# Patient Record
Sex: Female | Born: 1935 | Race: White | Hispanic: No | Smoking: Never smoker
Health system: Southern US, Community
[De-identification: ages and names within clinical notes are randomized; demographics above are authoritative.]

## PROBLEM LIST (undated history)

## (undated) DIAGNOSIS — M1990 Unspecified osteoarthritis, unspecified site: Secondary | ICD-10-CM

## (undated) DIAGNOSIS — I499 Cardiac arrhythmia, unspecified: Secondary | ICD-10-CM

## (undated) DIAGNOSIS — E785 Hyperlipidemia, unspecified: Secondary | ICD-10-CM

## (undated) DIAGNOSIS — I2699 Other pulmonary embolism without acute cor pulmonale: Secondary | ICD-10-CM

## (undated) DIAGNOSIS — H409 Unspecified glaucoma: Secondary | ICD-10-CM

## (undated) DIAGNOSIS — E079 Disorder of thyroid, unspecified: Secondary | ICD-10-CM

## (undated) DIAGNOSIS — I442 Atrioventricular block, complete: Secondary | ICD-10-CM

## (undated) DIAGNOSIS — C4490 Unspecified malignant neoplasm of skin, unspecified: Secondary | ICD-10-CM

## (undated) DIAGNOSIS — J189 Pneumonia, unspecified organism: Secondary | ICD-10-CM

## (undated) DIAGNOSIS — E039 Hypothyroidism, unspecified: Secondary | ICD-10-CM

## (undated) HISTORY — DX: Unspecified glaucoma: H40.9

## (undated) HISTORY — DX: Cardiac arrhythmia, unspecified: I49.9

## (undated) HISTORY — DX: Atrioventricular block, complete: I44.2

## (undated) HISTORY — DX: Hyperlipidemia, unspecified: E78.5

## (undated) HISTORY — DX: Unspecified osteoarthritis, unspecified site: M19.90

## (undated) HISTORY — DX: Disorder of thyroid, unspecified: E07.9

## (undated) HISTORY — PX: CATARACT EXTRACTION W/ INTRAOCULAR LENS  IMPLANT, BILATERAL: SHX1307

---

## 2001-03-30 HISTORY — PX: INSERT / REPLACE / REMOVE PACEMAKER: SUR710

## 2014-04-19 DIAGNOSIS — I7389 Other specified peripheral vascular diseases: Secondary | ICD-10-CM | POA: Diagnosis not present

## 2014-04-19 DIAGNOSIS — L03039 Cellulitis of unspecified toe: Secondary | ICD-10-CM | POA: Diagnosis not present

## 2014-05-24 DIAGNOSIS — I10 Essential (primary) hypertension: Secondary | ICD-10-CM | POA: Diagnosis not present

## 2014-05-24 DIAGNOSIS — I350 Nonrheumatic aortic (valve) stenosis: Secondary | ICD-10-CM | POA: Diagnosis not present

## 2014-05-24 DIAGNOSIS — E039 Hypothyroidism, unspecified: Secondary | ICD-10-CM | POA: Diagnosis not present

## 2014-05-24 DIAGNOSIS — Z950 Presence of cardiac pacemaker: Secondary | ICD-10-CM | POA: Diagnosis not present

## 2014-05-24 DIAGNOSIS — I471 Supraventricular tachycardia: Secondary | ICD-10-CM | POA: Diagnosis not present

## 2014-05-24 DIAGNOSIS — E782 Mixed hyperlipidemia: Secondary | ICD-10-CM | POA: Diagnosis not present

## 2014-05-24 DIAGNOSIS — I495 Sick sinus syndrome: Secondary | ICD-10-CM | POA: Diagnosis not present

## 2014-05-25 DIAGNOSIS — E039 Hypothyroidism, unspecified: Secondary | ICD-10-CM | POA: Diagnosis not present

## 2014-05-25 DIAGNOSIS — Z950 Presence of cardiac pacemaker: Secondary | ICD-10-CM | POA: Diagnosis not present

## 2014-05-25 DIAGNOSIS — M810 Age-related osteoporosis without current pathological fracture: Secondary | ICD-10-CM | POA: Diagnosis not present

## 2014-05-25 DIAGNOSIS — E785 Hyperlipidemia, unspecified: Secondary | ICD-10-CM | POA: Diagnosis not present

## 2014-06-23 DIAGNOSIS — I10 Essential (primary) hypertension: Secondary | ICD-10-CM | POA: Diagnosis not present

## 2014-06-23 DIAGNOSIS — I495 Sick sinus syndrome: Secondary | ICD-10-CM | POA: Diagnosis not present

## 2014-06-23 DIAGNOSIS — E038 Other specified hypothyroidism: Secondary | ICD-10-CM | POA: Diagnosis not present

## 2014-07-19 DIAGNOSIS — I739 Peripheral vascular disease, unspecified: Secondary | ICD-10-CM | POA: Diagnosis not present

## 2014-07-19 DIAGNOSIS — B351 Tinea unguium: Secondary | ICD-10-CM | POA: Diagnosis not present

## 2014-08-01 DIAGNOSIS — R0781 Pleurodynia: Secondary | ICD-10-CM | POA: Diagnosis not present

## 2014-08-01 DIAGNOSIS — S2232XA Fracture of one rib, left side, initial encounter for closed fracture: Secondary | ICD-10-CM | POA: Diagnosis not present

## 2014-08-23 DIAGNOSIS — Z950 Presence of cardiac pacemaker: Secondary | ICD-10-CM | POA: Diagnosis not present

## 2014-08-23 DIAGNOSIS — I495 Sick sinus syndrome: Secondary | ICD-10-CM | POA: Diagnosis not present

## 2014-08-23 DIAGNOSIS — I471 Supraventricular tachycardia: Secondary | ICD-10-CM | POA: Diagnosis not present

## 2014-09-19 DIAGNOSIS — I739 Peripheral vascular disease, unspecified: Secondary | ICD-10-CM | POA: Diagnosis not present

## 2014-09-19 DIAGNOSIS — L03039 Cellulitis of unspecified toe: Secondary | ICD-10-CM | POA: Diagnosis not present

## 2014-10-18 DIAGNOSIS — I495 Sick sinus syndrome: Secondary | ICD-10-CM | POA: Diagnosis not present

## 2014-10-18 DIAGNOSIS — E782 Mixed hyperlipidemia: Secondary | ICD-10-CM | POA: Diagnosis not present

## 2014-10-18 DIAGNOSIS — E039 Hypothyroidism, unspecified: Secondary | ICD-10-CM | POA: Diagnosis not present

## 2014-10-18 DIAGNOSIS — Z950 Presence of cardiac pacemaker: Secondary | ICD-10-CM | POA: Diagnosis not present

## 2014-10-18 DIAGNOSIS — I10 Essential (primary) hypertension: Secondary | ICD-10-CM | POA: Diagnosis not present

## 2014-10-18 DIAGNOSIS — I350 Nonrheumatic aortic (valve) stenosis: Secondary | ICD-10-CM | POA: Diagnosis not present

## 2014-10-18 DIAGNOSIS — I471 Supraventricular tachycardia: Secondary | ICD-10-CM | POA: Diagnosis not present

## 2014-10-19 DIAGNOSIS — M810 Age-related osteoporosis without current pathological fracture: Secondary | ICD-10-CM | POA: Diagnosis not present

## 2014-10-19 DIAGNOSIS — E785 Hyperlipidemia, unspecified: Secondary | ICD-10-CM | POA: Diagnosis not present

## 2014-10-19 DIAGNOSIS — Z950 Presence of cardiac pacemaker: Secondary | ICD-10-CM | POA: Diagnosis not present

## 2014-10-19 DIAGNOSIS — E039 Hypothyroidism, unspecified: Secondary | ICD-10-CM | POA: Diagnosis not present

## 2014-12-21 ENCOUNTER — Ambulatory Visit (INDEPENDENT_AMBULATORY_CARE_PROVIDER_SITE_OTHER): Payer: Medicare Other | Admitting: Internal Medicine

## 2014-12-21 ENCOUNTER — Encounter: Payer: Self-pay | Admitting: Internal Medicine

## 2014-12-21 VITALS — BP 108/60 | HR 85 | Temp 98.7°F | Resp 12 | Ht <= 58 in | Wt 110.0 lb

## 2014-12-21 DIAGNOSIS — H409 Unspecified glaucoma: Secondary | ICD-10-CM

## 2014-12-21 DIAGNOSIS — M810 Age-related osteoporosis without current pathological fracture: Secondary | ICD-10-CM

## 2014-12-21 DIAGNOSIS — E039 Hypothyroidism, unspecified: Secondary | ICD-10-CM

## 2014-12-21 DIAGNOSIS — B351 Tinea unguium: Secondary | ICD-10-CM | POA: Diagnosis not present

## 2014-12-21 DIAGNOSIS — Z23 Encounter for immunization: Secondary | ICD-10-CM

## 2014-12-21 DIAGNOSIS — Z950 Presence of cardiac pacemaker: Secondary | ICD-10-CM

## 2014-12-21 NOTE — Progress Notes (Signed)
Pre visit review using our clinic review tool, if applicable. No additional management support is needed unless otherwise documented below in the visit note. 

## 2014-12-21 NOTE — Patient Instructions (Signed)
We will not check labs today and have sent in the referral for the eye doctor, the foot doctor, and the pacemaker doctor.   We have given you the flu shot today.  Come back in about 6 months for a check on your thyroid.   Health Maintenance Adopting a healthy lifestyle and getting preventive care can go a long way to promote health and wellness. Talk with your health care provider about what schedule of regular examinations is right for you. This is a good chance for you to check in with your provider about disease prevention and staying healthy. In between checkups, there are plenty of things you can do on your own. Experts have done a lot of research about which lifestyle changes and preventive measures are most likely to keep you healthy. Ask your health care provider for more information. WEIGHT AND DIET  Eat a healthy diet  Be sure to include plenty of vegetables, fruits, low-fat dairy products, and lean protein.  Do not eat a lot of foods high in solid fats, added sugars, or salt.  Get regular exercise. This is one of the most important things you can do for your health.  Most adults should exercise for at least 150 minutes each week. The exercise should increase your heart rate and make you sweat (moderate-intensity exercise).  Most adults should also do strengthening exercises at least twice a week. This is in addition to the moderate-intensity exercise.  Maintain a healthy weight  Body mass index (BMI) is a measurement that can be used to identify possible weight problems. It estimates body fat based on height and weight. Your health care provider can help determine your BMI and help you achieve or maintain a healthy weight.  For females 17 years of age and older:   A BMI below 18.5 is considered underweight.  A BMI of 18.5 to 24.9 is normal.  A BMI of 25 to 29.9 is considered overweight.  A BMI of 30 and above is considered obese.  Watch levels of cholesterol and blood  lipids  You should start having your blood tested for lipids and cholesterol at 79 years of age, then have this test every 5 years.  You may need to have your cholesterol levels checked more often if:  Your lipid or cholesterol levels are high.  You are older than 79 years of age.  You are at high risk for heart disease.  CANCER SCREENING   Lung Cancer  Lung cancer screening is recommended for adults 32-53 years old who are at high risk for lung cancer because of a history of smoking.  A yearly low-dose CT scan of the lungs is recommended for people who:  Currently smoke.  Have quit within the past 15 years.  Have at least a 30-pack-year history of smoking. A pack year is smoking an average of one pack of cigarettes a day for 1 year.  Yearly screening should continue until it has been 15 years since you quit.  Yearly screening should stop if you develop a health problem that would prevent you from having lung cancer treatment.  Breast Cancer  Practice breast self-awareness. This means understanding how your breasts normally appear and feel.  It also means doing regular breast self-exams. Let your health care provider know about any changes, no matter how small.  If you are in your 20s or 30s, you should have a clinical breast exam (CBE) by a health care provider every 1-3 years as part of  a regular health exam.  If you are 66 or older, have a CBE every year. Also consider having a breast X-ray (mammogram) every year.  If you have a family history of breast cancer, talk to your health care provider about genetic screening.  If you are at high risk for breast cancer, talk to your health care provider about having an MRI and a mammogram every year.  Breast cancer gene (BRCA) assessment is recommended for women who have family members with BRCA-related cancers. BRCA-related cancers include:  Breast.  Ovarian.  Tubal.  Peritoneal cancers.  Results of the assessment  will determine the need for genetic counseling and BRCA1 and BRCA2 testing. Cervical Cancer Routine pelvic examinations to screen for cervical cancer are no longer recommended for nonpregnant women who are considered low risk for cancer of the pelvic organs (ovaries, uterus, and vagina) and who do not have symptoms. A pelvic examination may be necessary if you have symptoms including those associated with pelvic infections. Ask your health care provider if a screening pelvic exam is right for you.   The Pap test is the screening test for cervical cancer for women who are considered at risk.  If you had a hysterectomy for a problem that was not cancer or a condition that could lead to cancer, then you no longer need Pap tests.  If you are older than 65 years, and you have had normal Pap tests for the past 10 years, you no longer need to have Pap tests.  If you have had past treatment for cervical cancer or a condition that could lead to cancer, you need Pap tests and screening for cancer for at least 20 years after your treatment.  If you no longer get a Pap test, assess your risk factors if they change (such as having a new sexual partner). This can affect whether you should start being screened again.  Some women have medical problems that increase their chance of getting cervical cancer. If this is the case for you, your health care provider may recommend more frequent screening and Pap tests.  The human papillomavirus (HPV) test is another test that may be used for cervical cancer screening. The HPV test looks for the virus that can cause cell changes in the cervix. The cells collected during the Pap test can be tested for HPV.  The HPV test can be used to screen women 66 years of age and older. Getting tested for HPV can extend the interval between normal Pap tests from three to five years.  An HPV test also should be used to screen women of any age who have unclear Pap test results.  After  79 years of age, women should have HPV testing as often as Pap tests.  Colorectal Cancer  This type of cancer can be detected and often prevented.  Routine colorectal cancer screening usually begins at 79 years of age and continues through 79 years of age.  Your health care provider may recommend screening at an earlier age if you have risk factors for colon cancer.  Your health care provider may also recommend using home test kits to check for hidden blood in the stool.  A small camera at the end of a tube can be used to examine your colon directly (sigmoidoscopy or colonoscopy). This is done to check for the earliest forms of colorectal cancer.  Routine screening usually begins at age 22.  Direct examination of the colon should be repeated every 5-10 years through  79 years of age. However, you may need to be screened more often if early forms of precancerous polyps or small growths are found. Skin Cancer  Check your skin from head to toe regularly.  Tell your health care provider about any new moles or changes in moles, especially if there is a change in a mole's shape or color.  Also tell your health care provider if you have a mole that is larger than the size of a pencil eraser.  Always use sunscreen. Apply sunscreen liberally and repeatedly throughout the day.  Protect yourself by wearing long sleeves, pants, a wide-brimmed hat, and sunglasses whenever you are outside. HEART DISEASE, DIABETES, AND HIGH BLOOD PRESSURE   Have your blood pressure checked at least every 1-2 years. High blood pressure causes heart disease and increases the risk of stroke.  If you are between 57 years and 80 years old, ask your health care provider if you should take aspirin to prevent strokes.  Have regular diabetes screenings. This involves taking a blood sample to check your fasting blood sugar level.  If you are at a normal weight and have a low risk for diabetes, have this test once every  three years after 79 years of age.  If you are overweight and have a high risk for diabetes, consider being tested at a younger age or more often. PREVENTING INFECTION  Hepatitis B  If you have a higher risk for hepatitis B, you should be screened for this virus. You are considered at high risk for hepatitis B if:  You were born in a country where hepatitis B is common. Ask your health care provider which countries are considered high risk.  Your parents were born in a high-risk country, and you have not been immunized against hepatitis B (hepatitis B vaccine).  You have HIV or AIDS.  You use needles to inject street drugs.  You live with someone who has hepatitis B.  You have had sex with someone who has hepatitis B.  You get hemodialysis treatment.  You take certain medicines for conditions, including cancer, organ transplantation, and autoimmune conditions. Hepatitis C  Blood testing is recommended for:  Everyone born from [birth year] through 1963.  Anyone with known risk factors for hepatitis C. Sexually transmitted infections (STIs)  You should be screened for sexually transmitted infections (STIs) including gonorrhea and chlamydia if:  You are sexually active and are younger than 79 years of age.  You are older than 79 years of age and your health care provider tells you that you are at risk for this type of infection.  Your sexual activity has changed since you were last screened and you are at an increased risk for chlamydia or gonorrhea. Ask your health care provider if you are at risk.  If you do not have HIV, but are at risk, it may be recommended that you take a prescription medicine daily to prevent HIV infection. This is called pre-exposure prophylaxis (PrEP). You are considered at risk if:  You are sexually active and do not regularly use condoms or know the HIV status of your partner(s).  You take drugs by injection.  You are sexually active with a partner who  has HIV. Talk with your health care provider about whether you are at high risk of being infected with HIV. If you choose to begin PrEP, you should first be tested for HIV. You should then be tested every 3 months for as long as you are taking PrEP.  PREGNANCY   If you are premenopausal and you may become pregnant, ask your health care provider about preconception counseling.  If you may become pregnant, take 400 to 800 micrograms (mcg) of folic acid every day.  If you want to prevent pregnancy, talk to your health care provider about birth control (contraception). OSTEOPOROSIS AND MENOPAUSE   Osteoporosis is a disease in which the bones lose minerals and strength with aging. This can result in serious bone fractures. Your risk for osteoporosis can be identified using a bone density scan.  If you are 4 years of age or older, or if you are at risk for osteoporosis and fractures, ask your health care provider if you should be screened.  Ask your health care provider whether you should take a calcium or vitamin D supplement to lower your risk for osteoporosis.  Menopause may have certain physical symptoms and risks.  Hormone replacement therapy may reduce some of these symptoms and risks. Talk to your health care provider about whether hormone replacement therapy is right for you.  HOME CARE INSTRUCTIONS   Schedule regular health, dental, and eye exams.  Stay current with your immunizations.   Do not use any tobacco products including cigarettes, chewing tobacco, or electronic cigarettes.  If you are pregnant, do not drink alcohol.  If you are breastfeeding, limit how much and how often you drink alcohol.  Limit alcohol intake to no more than 1 drink per day for nonpregnant women. One drink equals 12 ounces of beer, 5 ounces of wine, or 1 ounces of hard liquor.  Do not use street drugs.  Do not share needles.  Ask your health care provider for help if you need support or  information about quitting drugs.  Tell your health care provider if you often feel depressed.  Tell your health care provider if you have ever been abused or do not feel safe at home. Document Released: 09/29/2010 Document Revised: 07/31/2013 Document Reviewed: 02/15/2013 [facility] Patient Information 2015 [place], [place]. This information is not intended to replace advice given to you by your health care provider. Make sure you discuss any questions you have with your health care provider.

## 2014-12-22 ENCOUNTER — Encounter: Payer: Self-pay | Admitting: Internal Medicine

## 2014-12-22 DIAGNOSIS — H409 Unspecified glaucoma: Secondary | ICD-10-CM | POA: Insufficient documentation

## 2014-12-22 DIAGNOSIS — E039 Hypothyroidism, unspecified: Secondary | ICD-10-CM | POA: Insufficient documentation

## 2014-12-22 DIAGNOSIS — B351 Tinea unguium: Secondary | ICD-10-CM | POA: Insufficient documentation

## 2014-12-22 DIAGNOSIS — M810 Age-related osteoporosis without current pathological fracture: Secondary | ICD-10-CM | POA: Insufficient documentation

## 2014-12-22 DIAGNOSIS — Z950 Presence of cardiac pacemaker: Secondary | ICD-10-CM | POA: Insufficient documentation

## 2014-12-22 NOTE — Progress Notes (Signed)
   Subjective:    Patient ID: Gloria Gonzalez, female    DOB: [birth year], 81 y.o.   MRN: [id number]  HPI The patient is a 81 YO female coming in for several medical conditions that need continuity. She has recently moved from [place] and needs a new doctor for her pacemaker (placed in 2003 and 100% paced, she is not clear of the underlying condition). She needs a glaucoma doctor (she is being monitored and treated for that). No vision loss. Next concern is her feet (generally goes to podiatry for toenails trimmed and also had some toenail fungus they were treating). No new complaints.   PMH, [facility], social history reviewed and updated.   Review of Systems  Constitutional: Negative for fever, activity change, appetite change, fatigue and unexpected weight change.  HENT: Negative.   Eyes: Negative.   Respiratory: Negative for cough, chest tightness, shortness of breath and wheezing.   Cardiovascular: Negative for chest pain, palpitations and leg swelling.  Gastrointestinal: Negative for abdominal pain, diarrhea, constipation and abdominal distention.  Musculoskeletal: Negative.   Skin: Negative.   Neurological: Negative.   Psychiatric/Behavioral: Negative.       Objective:   Physical Exam  Constitutional: She is oriented to person, place, and time. She appears well-developed and well-nourished.  HENT:  Head: Normocephalic and atraumatic.  Eyes: EOM are normal.  Neck: Normal range of motion.  Cardiovascular: Normal rate.   Pulmonary/Chest: Effort normal and breath sounds normal. No respiratory distress. She has no wheezes. She has no rales.  Abdominal: Soft. Bowel sounds are normal. She exhibits no distension. There is no tenderness. There is no rebound.  Musculoskeletal: She exhibits no edema.  Neurological: She is alert and oriented to person, place, and time. Coordination normal.  Skin: Skin is warm and dry.  Psychiatric: She has a normal mood and affect.   Filed Vitals:   12/21/14 1510   BP: 108/60  Pulse: 85  Temp: 98.7 F (37.1 C)  TempSrc: Oral  Resp: 12  Height: 4\' 9"  (1.448 m)  Weight: 110 lb (49.896 kg)  SpO2: 94%      Assessment & Plan:  Flu shot given at visit.

## 2014-12-22 NOTE — Assessment & Plan Note (Signed)
Taking synthroid 50 mcg daily and levels recently checked before she moves. Will not recheck today but will obtain records. Check levels next visit.

## 2014-12-22 NOTE — Assessment & Plan Note (Signed)
Referral to podiatry for management and toenail maintenance.

## 2014-12-22 NOTE — Assessment & Plan Note (Signed)
Referral to EP for battery life monitoring. She will likely be getting near the end of life since it is in place 13 year. Getting records for details about the device and why it was needed. She cannot recall.

## 2014-12-22 NOTE — Assessment & Plan Note (Signed)
This is why she is taking the evista however unclear why she is on this agent. She cannot recall how long she has been on so getting records to verify and see if it is time to stop. She has had unprovoked rib fractures in the past.

## 2014-12-22 NOTE — Assessment & Plan Note (Signed)
Referral to opththomology for ongoing management.

## 2015-01-02 ENCOUNTER — Telehealth: Payer: Self-pay | Admitting: *Deleted

## 2015-01-02 ENCOUNTER — Telehealth: Payer: Self-pay | Admitting: Internal Medicine

## 2015-01-02 NOTE — Telephone Encounter (Signed)
Amy from [facility] called, they never got the pt's cardiac records, we will have pt sign a med release when she comes to the appt and try to get the records while she is here

## 2015-01-02 NOTE — Telephone Encounter (Signed)
Brandy at [facility] called and she was looking at your last notes about requesting pt's cardiac records for her pace maker.  She is wondering if you received them yet and if you have can you fax them to [phone].  Pt's appointment is on 10/7 with them. You can reach [place] at [phone].

## 2015-01-02 NOTE — Telephone Encounter (Signed)
called [facility] to see if Dr. Valene Bors got cardiac med records as she stated she would do in her OV, spoke with lori, she left a msg with the docs asst to call me back or fax me the records, RECORDS REQUESTED.Marland Kitchen

## 2015-01-02 NOTE — Telephone Encounter (Signed)
Spoke with [place]. She will have patient fill out a records release form at the visit. I do not have the cardiology notes.

## 2015-01-03 ENCOUNTER — Telehealth: Payer: Self-pay | Admitting: Internal Medicine

## 2015-01-03 NOTE — Telephone Encounter (Signed)
Rec'd from [facility] forward 8 pages to Dr. Doug Sou

## 2015-01-04 ENCOUNTER — Encounter: Payer: Self-pay | Admitting: Cardiology

## 2015-01-04 ENCOUNTER — Ambulatory Visit (INDEPENDENT_AMBULATORY_CARE_PROVIDER_SITE_OTHER): Payer: Medicare Other | Admitting: Cardiology

## 2015-01-04 VITALS — BP 118/70 | HR 77 | Ht <= 58 in | Wt 112.0 lb

## 2015-01-04 DIAGNOSIS — Z950 Presence of cardiac pacemaker: Secondary | ICD-10-CM

## 2015-01-04 DIAGNOSIS — I442 Atrioventricular block, complete: Secondary | ICD-10-CM

## 2015-01-04 LAB — CUP PACEART INCLINIC DEVICE CHECK
Battery Voltage: 2.73 V
Brady Statistic AP VP Percent: 0.6 %
Lead Channel Pacing Threshold Pulse Width: 0.2 ms
Lead Channel Pacing Threshold Pulse Width: 0.25 ms
Lead Channel Setting Pacing Amplitude: 1.5 V
Lead Channel Setting Pacing Pulse Width: 0.2 ms
MDC IDC MSMT LEADCHNL RA IMPEDANCE VALUE: 730 Ohm
MDC IDC MSMT LEADCHNL RA PACING THRESHOLD AMPLITUDE: 1 V
MDC IDC MSMT LEADCHNL RA SENSING INTR AMPL: 2 mV
MDC IDC MSMT LEADCHNL RV PACING THRESHOLD AMPLITUDE: 0.5 V
MDC IDC SESS DTM: 20161007171933
MDC IDC SET LEADCHNL RA PACING AMPLITUDE: 2 V
MDC IDC SET LEADCHNL RV SENSING SENSITIVITY: 2.8 mV
MDC IDC STAT BRADY AP VS PERCENT: 0.1 % — AB
MDC IDC STAT BRADY AS VP PERCENT: 99.3 %
MDC IDC STAT BRADY AS VS PERCENT: 0.1 % — AB

## 2015-01-04 NOTE — Progress Notes (Signed)
Electrophysiology Office Note   Date:  01/04/2015   ID:  Gloria Gonzalez, DOB 09-12-1921, MRN [id number]  PCP:  Hoyt Koch, MD   Primary Electrophysiologist:  Khai Arrona Meredith Leeds, MD    Chief Complaint  Patient presents with  . Pacemaker Problem     History of Present Illness: Ronisha Herringshaw is a 81 y.o. female who presents today for electrophysiology evaluation.   She has a pacemaker which was placed in 2003.  The device was implanted for an irregular rhythm, likely some sort of heart block.     Today, she denies symptoms of palpitations, chest pain, shortness of breath, orthopnea, PND, lower extremity edema, claudication, dizziness, presyncope, syncope, bleeding, or neurologic sequela. The patient is tolerating medications without difficulties and is otherwise without complaint today.    Past Medical History  Diagnosis Date  . Arthritis   . Glaucoma   . Hyperlipidemia   . Thyroid disease   . Arrhythmia    Past Surgical History  Procedure Laterality Date  . Eye surgery    . Pacemaker insertion  2003     Current Outpatient Prescriptions  Medication Sig Dispense Refill  . atorvastatin (LIPITOR) 20 MG tablet Take 20 mg by mouth daily at 6 PM.   2  . Calcium Citrate-Vitamin D (CALCIUM + D PO) Take 1 tablet by mouth daily.    Marland Kitchen omega-3 acid ethyl esters (LOVAZA) 1 G capsule Take 1 g by mouth 2 (two) times daily.   0  . raloxifene (EVISTA) 60 MG tablet Take 60 mg by mouth daily.   2  . SYNTHROID 50 MCG tablet Take 50 mcg by mouth daily before breakfast.   2   No current facility-administered medications for this visit.    Allergies:   Review of patient's allergies indicates no known allergies.   Social History:  The patient  reports that she has never smoked. She does not have any smokeless tobacco history on file. She reports that she does not drink alcohol or use illicit drugs.   Family History:  [name] family history includes Arthritis in her  mother; Heart disease in her brother and sister.    ROS:  Please see the history of present illness.   All other systems are reviewed and negative.    PHYSICAL EXAM: VS:  BP 118/70 mmHg  Pulse 77  Ht 4\' 9"  (1.448 m)  Wt 112 lb (50.803 kg)  BMI 24.23 kg/m2 , BMI Body mass index is 24.23 kg/(m^2). GEN: Well nourished, well developed, in no acute distress HEENT: normal Neck: no JVD, carotid bruits, or masses Cardiac: RRR; no murmurs, rubs, or gallops,no edema  Respiratory:  clear to auscultation bilaterally, normal work of breathing GI: soft, nontender, nondistended, + BS MS: no deformity or atrophy Skin: warm and dry, device pocket is well healed Neuro:  Strength and sensation are intact Psych: euthymic mood, full affect  EKG:  EKG is ordered today. The ekg ordered today shows sinus rhythm, V paced  Device interrogation is reviewed today in detail.  See PaceArt for details.  Functioning well, 17 months left on battery.   Recent Labs: No results found for requested labs within last 365 days.    Lipid Panel  No results found for: CHOL, TRIG, HDL, CHOLHDL, VLDL, LDLCALC, LDLDIRECT   Wt Readings from Last 3 Encounters:  01/04/15 112 lb (50.803 kg)  12/21/14 110 lb (49.896 kg)   ASSESSMENT AND PLAN:  1.  Pacemaker: The patient does not know  the reason for the implant currently.  100% V paced currently.  She still has 17 months until she requires generator change.  We Longino Trefz plan for her to come back to clinic every 3 months for device check and I Lorrie Gargan see her yearly.  Constance Hackenberg not change any medications today as she has no acute complaints.      Current medicines are reviewed at length with the patient today.   The patient does not have concerns regarding her medicines.  The following changes were made today:  none  Labs/ tests ordered today include: none  No orders of the defined types were placed in this encounter.     Disposition:   FU with Velia Pamer 1  year  Signed, Janette Harvie Meredith Leeds, MD  01/04/2015 4:26 PM     [address] [place] [address] [phone] (office) [phone] (fax)

## 2015-01-04 NOTE — Patient Instructions (Signed)
Medication Instructions:  Your physician recommends that you continue on your current medications as directed. Please refer to the Current Medication list given to you today.  Labwork: None ordered  Testing/Procedures: None ordered  Follow-Up: Your physician recommends that you schedule a follow-up appointment in: 3 months with device clinic.  Your physician wants you to follow-up in: 1 year with Dr. Curt Bears.  You will receive a reminder letter in the mail two months in advance. If you don't receive a letter, please call our office to schedule the follow-up appointment.  Any Other Special Instructions Will Be Listed Below (If Applicable). Thank you for choosing [facility]!!   Trinidad Curet, RN [phone]

## 2015-01-11 ENCOUNTER — Encounter: Payer: Self-pay | Admitting: Podiatry

## 2015-01-11 ENCOUNTER — Ambulatory Visit (INDEPENDENT_AMBULATORY_CARE_PROVIDER_SITE_OTHER): Payer: Medicare Other | Admitting: Podiatry

## 2015-01-11 VITALS — BP 101/58 | HR 89 | Resp 12

## 2015-01-11 DIAGNOSIS — M79673 Pain in unspecified foot: Secondary | ICD-10-CM

## 2015-01-11 DIAGNOSIS — B351 Tinea unguium: Secondary | ICD-10-CM | POA: Diagnosis not present

## 2015-01-11 DIAGNOSIS — M79609 Pain in unspecified limb: Principal | ICD-10-CM

## 2015-01-11 NOTE — Progress Notes (Signed)
   Subjective:    Patient ID: Gloria Gonzalez, female    DOB: Aug 09, 1921, 81 y.o.   MRN: [id number]  HPI  Patient presents here today for a B/L debridement. She says she has moved here from [place] area for nail care.  She says she has been seeing a podiatrist who regularly treats her fungus nails. She says her nails have grown long and are painful walking and wearing her shoes.  Review of Systems  Cardiovascular: Positive for leg swelling.  Musculoskeletal:       Joint pain  All other systems reviewed and are negative.      Objective:   Physical Exam GENERAL APPEARANCE: Alert, conversant. Appropriately groomed. No acute distress.  VASCULAR: Pedal pulses are not  palpable at  [facility] and PT bilateral.  Capillary refill time is immediate to all digits,  Cold feet noted.   NEUROLOGIC: sensation is normal to 5.07 monofilament at 5/5 sites bilateral.  Light touch is intact bilateral, Muscle strength normal.  MUSCULOSKELETAL: acceptable muscle strength, tone and stability bilateral.  Intrinsic muscluature intact bilateral.  Rectus appearance of foot and digits noted bilateral.   DERMATOLOGIC: skin color, texture, and turgor are within normal limits.  No preulcerative lesions or ulcers  are seen, no interdigital maceration noted.  No open lesions present. . No drainage noted.  NAILS  Thick disfigured discolored nails  Hallux B/l         Assessment & Plan:  Onychomycosis  B/L  IE  Debridement of long thick disfigured discolored nails both feet.  RTC 3 months.

## 2015-01-15 ENCOUNTER — Encounter: Payer: Self-pay | Admitting: Cardiology

## 2015-02-11 ENCOUNTER — Telehealth: Payer: Self-pay | Admitting: Internal Medicine

## 2015-02-11 ENCOUNTER — Other Ambulatory Visit: Payer: Self-pay | Admitting: Geriatric Medicine

## 2015-02-11 DIAGNOSIS — Z950 Presence of cardiac pacemaker: Secondary | ICD-10-CM

## 2015-02-11 MED ORDER — OMEGA-3-ACID ETHYL ESTERS 1 G PO CAPS: 1.0000 g | ORAL_CAPSULE | Freq: Two times a day (BID) | ORAL | Status: DC

## 2015-02-11 NOTE — Telephone Encounter (Signed)
Pt requesting refill for omega-3 acid ethyl esters (LOVAZA) 1 G capsule [phone]  Pharmacy is [facility]

## 2015-03-22 DIAGNOSIS — H01002 Unspecified blepharitis right lower eyelid: Secondary | ICD-10-CM | POA: Diagnosis not present

## 2015-03-22 DIAGNOSIS — H01004 Unspecified blepharitis left upper eyelid: Secondary | ICD-10-CM | POA: Diagnosis not present

## 2015-03-22 DIAGNOSIS — H353131 Nonexudative age-related macular degeneration, bilateral, early dry stage: Secondary | ICD-10-CM | POA: Diagnosis not present

## 2015-03-22 DIAGNOSIS — H01005 Unspecified blepharitis left lower eyelid: Secondary | ICD-10-CM | POA: Diagnosis not present

## 2015-03-22 DIAGNOSIS — H01001 Unspecified blepharitis right upper eyelid: Secondary | ICD-10-CM | POA: Diagnosis not present

## 2015-03-22 DIAGNOSIS — H401133 Primary open-angle glaucoma, bilateral, severe stage: Secondary | ICD-10-CM | POA: Diagnosis not present

## 2015-03-22 DIAGNOSIS — H524 Presbyopia: Secondary | ICD-10-CM | POA: Diagnosis not present

## 2015-04-10 ENCOUNTER — Encounter: Payer: Self-pay | Admitting: Cardiology

## 2015-04-10 ENCOUNTER — Ambulatory Visit (INDEPENDENT_AMBULATORY_CARE_PROVIDER_SITE_OTHER): Payer: Medicare Other | Admitting: *Deleted

## 2015-04-10 DIAGNOSIS — Z950 Presence of cardiac pacemaker: Secondary | ICD-10-CM

## 2015-04-10 DIAGNOSIS — I442 Atrioventricular block, complete: Secondary | ICD-10-CM | POA: Diagnosis not present

## 2015-04-10 LAB — CUP PACEART INCLINIC DEVICE CHECK
Brady Statistic AP VS Percent: 0.1 % — CL
Date Time Interrogation Session: 20170111170426
Implantable Lead Implant Date: 20030601
Implantable Lead Implant Date: 20030601
Implantable Lead Location: 753860
Implantable Lead Model: 4092
Lead Channel Pacing Threshold Amplitude: 1 V
Lead Channel Pacing Threshold Amplitude: 5 V
Lead Channel Pacing Threshold Pulse Width: 0.2 ms
Lead Channel Pacing Threshold Pulse Width: 0.25 ms
Lead Channel Sensing Intrinsic Amplitude: 2 mV
Lead Channel Setting Pacing Amplitude: 1.5 V
Lead Channel Setting Pacing Amplitude: 2 V
Lead Channel Setting Pacing Pulse Width: 0.2 ms
MDC IDC LEAD LOCATION: 753859
MDC IDC LEAD MODEL: 4592
MDC IDC MSMT LEADCHNL RA IMPEDANCE VALUE: 760 Ohm
MDC IDC MSMT LEADCHNL RV IMPEDANCE VALUE: 1020 Ohm
MDC IDC SET LEADCHNL RV SENSING SENSITIVITY: 2.8 mV
MDC IDC STAT BRADY AP VP PERCENT: 0.2 %
MDC IDC STAT BRADY AS VP PERCENT: 99.8 %
MDC IDC STAT BRADY AS VS PERCENT: 0.1 % — AB

## 2015-04-10 NOTE — Progress Notes (Signed)
Pacemaker check in clinic. Normal device function. Thresholds, sensing, impedances consistent with previous measurements. Device programmed to maximize longevity. 25 mode switches- longest 1 min. 1 high ventricular rate noted- 2 seconds @ 197bpm, no EGM. Device programmed at appropriate safety margins. Histogram distribution appropriate for patient activity level. Device programmed to optimize intrinsic conduction. Estimated longevity <1-31 months with an average of 16 months. ROV with device clinic in 2 months for battery check. ROV with WC in October.

## 2015-04-12 ENCOUNTER — Ambulatory Visit: Payer: Medicare Other | Admitting: Podiatry

## 2015-04-17 ENCOUNTER — Ambulatory Visit (INDEPENDENT_AMBULATORY_CARE_PROVIDER_SITE_OTHER): Payer: Medicare Other | Admitting: Podiatry

## 2015-04-17 ENCOUNTER — Encounter: Payer: Self-pay | Admitting: Podiatry

## 2015-04-17 DIAGNOSIS — B351 Tinea unguium: Secondary | ICD-10-CM

## 2015-04-17 DIAGNOSIS — M79609 Pain in unspecified limb: Principal | ICD-10-CM

## 2015-04-17 DIAGNOSIS — M79673 Pain in unspecified foot: Secondary | ICD-10-CM | POA: Diagnosis not present

## 2015-04-17 NOTE — Progress Notes (Signed)
   Subjective:    Patient ID: Gloria Gonzalez, female    DOB: 01-Aug-1921, 82 y.o.   MRN: [id number]  HPI  Patient presents here today for a B/L debridement. She says she has moved here from [place] area for nail care.  She says she has been seeing a podiatrist who regularly treats her fungus nails. She says her nails have grown long and are painful walking and wearing her shoes. She presents for preventive foot care services.  Review of Systems  Cardiovascular: Positive for leg swelling.  Musculoskeletal:       Joint pain  All other systems reviewed and are negative.      Objective:   Physical Exam GENERAL APPEARANCE: Alert, conversant. Appropriately groomed. No acute distress.  VASCULAR: Pedal pulses are not  palpable at  [facility] and PT bilateral.  Capillary refill time is immediate to all digits,  Cold feet noted.   NEUROLOGIC: sensation is normal to 5.07 monofilament at 5/5 sites bilateral.  Light touch is intact bilateral, Muscle strength normal.  MUSCULOSKELETAL: acceptable muscle strength, tone and stability bilateral.  Intrinsic muscluature intact bilateral.  Rectus appearance of foot and digits noted bilateral.   DERMATOLOGIC: skin color, texture, and turgor are within normal limits.  No preulcerative lesions or ulcers  are seen, no interdigital maceration noted.  No open lesions present. . No drainage noted.  NAILS  Thick disfigured discolored nails  Hallux B/l         Assessment & Plan:  Onychomycosis  B/L  IE  Debridement of long thick disfigured discolored nails both feet.  RTC 3 months.

## 2015-04-23 DIAGNOSIS — H401133 Primary open-angle glaucoma, bilateral, severe stage: Secondary | ICD-10-CM | POA: Diagnosis not present

## 2015-04-23 DIAGNOSIS — H04123 Dry eye syndrome of bilateral lacrimal glands: Secondary | ICD-10-CM | POA: Diagnosis not present

## 2015-04-25 ENCOUNTER — Telehealth: Payer: Self-pay | Admitting: Internal Medicine

## 2015-04-25 ENCOUNTER — Other Ambulatory Visit: Payer: Self-pay | Admitting: Geriatric Medicine

## 2015-04-25 DIAGNOSIS — Z950 Presence of cardiac pacemaker: Secondary | ICD-10-CM

## 2015-04-25 DIAGNOSIS — E039 Hypothyroidism, unspecified: Secondary | ICD-10-CM

## 2015-04-25 MED ORDER — ATORVASTATIN CALCIUM 20 MG PO TABS: 20.0000 mg | ORAL_TABLET | Freq: Every day | ORAL | Status: DC

## 2015-04-25 NOTE — Telephone Encounter (Signed)
Pt requesting refills for atorvastatin (LIPITOR) 20 MG tablet [id number], raloxifene (EVISTA) 60 MG tablet [id number], and SYNTHROID 50 MCG tablet [id number]  Pharmacy is [facility]

## 2015-04-25 NOTE — Telephone Encounter (Signed)
Sent to pharmacy 

## 2015-04-29 ENCOUNTER — Other Ambulatory Visit: Payer: Self-pay | Admitting: Internal Medicine

## 2015-04-29 DIAGNOSIS — Z950 Presence of cardiac pacemaker: Secondary | ICD-10-CM

## 2015-04-29 DIAGNOSIS — E039 Hypothyroidism, unspecified: Secondary | ICD-10-CM

## 2015-04-29 MED ORDER — ATORVASTATIN CALCIUM 20 MG PO TABS: 20.0000 mg | ORAL_TABLET | Freq: Every day | ORAL | Status: DC

## 2015-04-29 MED ORDER — SYNTHROID 50 MCG PO TABS
50.0000 ug | ORAL_TABLET | Freq: Every day | ORAL | Status: DC
Start: 2015-04-29 — End: 2016-06-23

## 2015-04-29 MED ORDER — SYNTHROID 50 MCG PO TABS: 50.0000 ug | ORAL_TABLET | Freq: Every day | ORAL | Status: DC

## 2015-04-29 NOTE — Telephone Encounter (Signed)
Patient is requesting synthroid and atorvastatin to be sent to [facility] .

## 2015-04-29 NOTE — Telephone Encounter (Signed)
rx faxed to mail order 

## 2015-04-29 NOTE — Telephone Encounter (Signed)
Sent to pharmacy 

## 2015-06-10 ENCOUNTER — Ambulatory Visit (INDEPENDENT_AMBULATORY_CARE_PROVIDER_SITE_OTHER): Payer: Medicare Other | Admitting: *Deleted

## 2015-06-10 ENCOUNTER — Encounter: Payer: Self-pay | Admitting: Cardiology

## 2015-06-10 DIAGNOSIS — Z950 Presence of cardiac pacemaker: Secondary | ICD-10-CM

## 2015-06-10 LAB — CUP PACEART INCLINIC DEVICE CHECK
Battery Voltage: 2.72 V
Date Time Interrogation Session: 20170313130538
Implantable Lead Implant Date: 20031112
Implantable Lead Location: 753860
Implantable Lead Model: 4092
Lead Channel Impedance Value: 1065 Ohm
Lead Channel Impedance Value: 746 Ohm
Lead Channel Setting Pacing Amplitude: 2 V
Lead Channel Setting Pacing Pulse Width: 0.2 ms
MDC IDC LEAD IMPLANT DT: 20031112
MDC IDC LEAD LOCATION: 753859
MDC IDC LEAD MODEL: 4592
MDC IDC MSMT BATTERY IMPEDANCE: 4519 Ohm
MDC IDC MSMT BATTERY REMAINING LONGEVITY: 14 mo
MDC IDC SET LEADCHNL RV PACING AMPLITUDE: 1.5 V
MDC IDC SET LEADCHNL RV SENSING SENSITIVITY: 2.8 mV

## 2015-06-10 NOTE — Progress Notes (Signed)
Battery check in clinic. Estimated longevity 14 months (<1-28 months). 10 mode switches since last check, longest 1min 54sec--no EGM. Available EGM (from 04/14/15 mode switch) suggests ? atrial noise, unable to reproduce in clinic. ROV with device clinic on 08/01/15 for battery check (billable) and with WC in 12/2015.

## 2015-06-21 ENCOUNTER — Ambulatory Visit (INDEPENDENT_AMBULATORY_CARE_PROVIDER_SITE_OTHER): Payer: Medicare Other | Admitting: Internal Medicine

## 2015-06-21 ENCOUNTER — Ambulatory Visit (INDEPENDENT_AMBULATORY_CARE_PROVIDER_SITE_OTHER)
Admission: RE | Admit: 2015-06-21 | Discharge: 2015-06-21 | Disposition: A | Discharge status: Home / Self Care | Payer: Medicare Other | Source: Ambulatory Visit | Attending: Internal Medicine | Admitting: Internal Medicine

## 2015-06-21 ENCOUNTER — Other Ambulatory Visit (INDEPENDENT_AMBULATORY_CARE_PROVIDER_SITE_OTHER): Payer: Medicare Other

## 2015-06-21 ENCOUNTER — Encounter: Payer: Self-pay | Admitting: Internal Medicine

## 2015-06-21 ENCOUNTER — Telehealth: Payer: Self-pay | Admitting: Internal Medicine

## 2015-06-21 VITALS — BP 92/50 | HR 81 | Temp 98.4°F | Resp 12 | Ht <= 58 in | Wt 112.0 lb

## 2015-06-21 DIAGNOSIS — Z950 Presence of cardiac pacemaker: Secondary | ICD-10-CM | POA: Diagnosis not present

## 2015-06-21 DIAGNOSIS — E039 Hypothyroidism, unspecified: Secondary | ICD-10-CM

## 2015-06-21 DIAGNOSIS — Z23 Encounter for immunization: Secondary | ICD-10-CM

## 2015-06-21 DIAGNOSIS — M810 Age-related osteoporosis without current pathological fracture: Secondary | ICD-10-CM

## 2015-06-21 DIAGNOSIS — E785 Hyperlipidemia, unspecified: Secondary | ICD-10-CM

## 2015-06-21 LAB — COMPREHENSIVE METABOLIC PANEL
ALT: 11 U/L (ref 0–35)
AST: 14 U/L (ref 0–37)
Albumin: 4.2 g/dL (ref 3.5–5.2)
Alkaline Phosphatase: 44 U/L (ref 39–117)
BILIRUBIN TOTAL: 0.4 mg/dL (ref 0.2–1.2)
BUN: 34 mg/dL — ABNORMAL HIGH (ref 6–23)
CALCIUM: 9.8 mg/dL (ref 8.4–10.5)
CO2: 27 meq/L (ref 19–32)
CREATININE: 1.06 mg/dL (ref 0.40–1.20)
Chloride: 103 mEq/L (ref 96–112)
GFR: 51.53 mL/min — AB (ref >60.00)
GLUCOSE: 89 mg/dL (ref 70–99)
POTASSIUM: 4.3 meq/L (ref 3.5–5.1)
Sodium: 138 mEq/L (ref 135–145)
Total Protein: 7.3 g/dL (ref 6.0–8.3)

## 2015-06-21 LAB — TSH: TSH: 18.86 u[IU]/mL — AB (ref 0.35–4.50)

## 2015-06-21 LAB — CBC
HEMATOCRIT: 38.6 % (ref 36.0–46.0)
HEMOGLOBIN: 13.7 g/dL (ref 12.0–15.0)
MCHC: 35.6 g/dL (ref 30.0–36.0)
MCV: 94.8 fl (ref 78.0–100.0)
PLATELETS: 283 10*3/uL (ref 150.0–400.0)
RBC: 4.07 Mil/uL (ref 3.87–5.11)
RDW: 13.6 % (ref 11.5–15.5)
WBC: 9 10*3/uL (ref 4.0–10.5)

## 2015-06-21 LAB — T4, FREE: Free T4: 0.83 ng/dL (ref 0.60–1.60)

## 2015-06-21 MED ORDER — RALOXIFENE HCL 60 MG PO TABS: 60.0000 mg | ORAL_TABLET | Freq: Every day | ORAL | Status: DC

## 2015-06-21 MED ORDER — RETINOL MOLECULAR FILM 0.3 % OIL: TOPICAL_OIL | Status: DC

## 2015-06-21 MED ORDER — OMEGA-3-ACID ETHYL ESTERS 1 G PO CAPS: 1.0000 g | ORAL_CAPSULE | Freq: Two times a day (BID) | ORAL | Status: DC

## 2015-06-21 NOTE — Progress Notes (Signed)
   Subjective:    Patient ID: Gloria Gonzalez, female    DOB: 04-Apr-1921, 82 y.o.   MRN: [id number]  HPI The patient is a 82 YO female coming in for follow up of her medical problems including her osteoporosis (taking evista, no bone density in many years, several fractures in the last 5 years), her thyroid (still taking her synthroid 50 mcg daily, no tremors, constipation or diarrhea), and her cholesterol (doing well on lipitor and lovaza, no complications or side effects, last LDL at goal about 1 year ago). No new problems.   Review of Systems  Constitutional: Negative for fever, activity change, appetite change, fatigue and unexpected weight change.  HENT: Negative.   Eyes: Negative.   Respiratory: Negative for cough, chest tightness, shortness of breath and wheezing.   Cardiovascular: Negative for chest pain, palpitations and leg swelling.  Gastrointestinal: Negative for abdominal pain, diarrhea, constipation and abdominal distention.  Musculoskeletal: Negative.   Skin: Negative.   Neurological: Negative.   Psychiatric/Behavioral: Negative.       Objective:   Physical Exam  Constitutional: She is oriented to person, place, and time. She appears well-developed and well-nourished.  HENT:  Head: Normocephalic and atraumatic.  Eyes: EOM are normal.  Neck: Normal range of motion.  Cardiovascular: Normal rate.   Pulmonary/Chest: Effort normal and breath sounds normal. No respiratory distress. She has no wheezes. She has no rales.  Abdominal: Soft. Bowel sounds are normal. She exhibits no distension. There is no tenderness. There is no rebound.  Musculoskeletal: She exhibits no edema.  Neurological: She is alert and oriented to person, place, and time. Coordination normal.  Skin: Skin is warm and dry.  Psychiatric: She has a normal mood and affect.   Filed Vitals:   06/21/15 1303  BP: 92/50  Pulse: 81  Temp: 98.4 F (36.9 C)  TempSrc: Oral  Resp: 12  Height: 4\' 8"  (1.422 m)    Weight: 112 lb (50.803 kg)  SpO2: 96%      Assessment & Plan:  Prevnar 13 given at visit.

## 2015-06-21 NOTE — Telephone Encounter (Signed)
Ronalee Belts called from [facility] stated they do not have Vitamin A (RETINOL MOLECULAR FILM) 0.3 % OIL. He was wondering if Dr. Sharlet Salina can change to something else, please advise.

## 2015-06-21 NOTE — Assessment & Plan Note (Signed)
Taking lipitor and lovaza and at goal with last LDL. Will not recheck due to age. No side effects at this time. Talked to them that they could stop the lovaza but they wish to continue.

## 2015-06-21 NOTE — Assessment & Plan Note (Signed)
Taking evista and have ordered bone density to follow her density. Taking calcium and vitamin D as well.

## 2015-06-21 NOTE — Progress Notes (Signed)
Pre visit review using our clinic review tool, if applicable. No additional management support is needed unless otherwise documented below in the visit note. 

## 2015-06-21 NOTE — Assessment & Plan Note (Signed)
Checking TSH and adjust as needed. Currently taking synthroid 50 mcg daily.

## 2015-06-21 NOTE — Patient Instructions (Signed)
We have sent in the retinol oil for the spots on your head. Apply daily for 1 month to see results.   We are checking the labs and the bone density today.   We have sent in the refills.

## 2015-06-24 MED ORDER — ROC RETINOL CORREXION EX CREA: TOPICAL_CREAM | CUTANEOUS | Status: DC

## 2015-06-24 NOTE — Telephone Encounter (Signed)
This was the only thing that was covered, we can change but the insurance may not pay for it.

## 2015-07-02 ENCOUNTER — Ambulatory Visit: Payer: Medicare Other

## 2015-07-02 ENCOUNTER — Telehealth: Payer: Self-pay | Admitting: Internal Medicine

## 2015-07-02 NOTE — Telephone Encounter (Signed)
Pt called stated that she had a pneumonia injection on 06/21/15, she said that on Sat it is stated to get red about 2 inch and now it is getting bigger about 3x4 around. Pt was wondering if she need to be concern about this and what to do. Please all her back, she also has a question about the Vit A rx.

## 2015-07-02 NOTE — Telephone Encounter (Signed)
Called and spoke with patient. She will be in for a nurse visit today at 1 pm.

## 2015-07-04 ENCOUNTER — Telehealth: Payer: Self-pay | Admitting: Internal Medicine

## 2015-07-04 NOTE — Telephone Encounter (Signed)
Patient Name: Gloria Gonzalez  DOB: [birth year]    Initial Comment Caller states she has a rash on forehead. Has questions about rx she was given.   Nurse Assessment  Nurse: Raphael Gibney, RN, Vera Date/Time (Eastern Time): 07/04/2015 2:09:01 PM  Confirm and document reason for call. If symptomatic, describe symptoms. You must click the next button to save text entered. ---Caller states she has a rash on her forehead. Saw Dr. Sharlet Salina on March 24 and she prescribed Retinol that [facility] does not have Has had rash for a number of years. has used aqua phor in the past which does not help. Rash is not worse.  Has the patient traveled out of the country within the last 30 days? ---No  Does the patient have any new or worsening symptoms? ---No  Please document clinical information provided and list any resource used. ---Advised caller that note would be sent to Dr. Sharlet Salina regarding a pharmacy that has the Retinol or to change the medication to something that [facility] has. Verbalized understanding.     Guidelines    Guideline Title Affirmed Question Affirmed Notes       Final Disposition User   Clinical Call [name], RN, Vera    Comments  Pt wants to know where she can get Retinol or if Dr. Sharlet Salina will order something else that she can get from [facility]. Please call pt back and let her know.

## 2015-07-30 DIAGNOSIS — D3142 Benign neoplasm of left ciliary body: Secondary | ICD-10-CM | POA: Diagnosis not present

## 2015-07-30 DIAGNOSIS — H401133 Primary open-angle glaucoma, bilateral, severe stage: Secondary | ICD-10-CM | POA: Diagnosis not present

## 2015-07-30 DIAGNOSIS — H04123 Dry eye syndrome of bilateral lacrimal glands: Secondary | ICD-10-CM | POA: Diagnosis not present

## 2015-08-01 ENCOUNTER — Ambulatory Visit (INDEPENDENT_AMBULATORY_CARE_PROVIDER_SITE_OTHER): Payer: Medicare Other | Admitting: *Deleted

## 2015-08-01 ENCOUNTER — Encounter: Payer: Self-pay | Admitting: Cardiology

## 2015-08-01 DIAGNOSIS — Z950 Presence of cardiac pacemaker: Secondary | ICD-10-CM

## 2015-08-01 LAB — CUP PACEART INCLINIC DEVICE CHECK
Brady Statistic AP VP Percent: 0.1 % — CL
Brady Statistic RA Percent Paced: 0 %
Date Time Interrogation Session: 20170504142002
Lead Channel Pacing Threshold Pulse Width: 0.2 ms
Lead Channel Setting Pacing Amplitude: 1.5 V
Lead Channel Setting Pacing Amplitude: 2 V
Lead Channel Setting Sensing Sensitivity: 2.8 mV
MDC IDC LEAD IMPLANT DT: 20031112
MDC IDC LEAD IMPLANT DT: 20031112
MDC IDC LEAD LOCATION: 753859
MDC IDC LEAD LOCATION: 753860
MDC IDC LEAD MODEL: 4592
MDC IDC MSMT LEADCHNL RA IMPEDANCE VALUE: 595 Ohm
MDC IDC MSMT LEADCHNL RA PACING THRESHOLD AMPLITUDE: 1 V
MDC IDC MSMT LEADCHNL RA PACING THRESHOLD PULSEWIDTH: 0.25 ms
MDC IDC MSMT LEADCHNL RA SENSING INTR AMPL: 2 mV
MDC IDC MSMT LEADCHNL RV IMPEDANCE VALUE: 1017 Ohm
MDC IDC MSMT LEADCHNL RV PACING THRESHOLD AMPLITUDE: 0.5 V
MDC IDC SET LEADCHNL RV PACING PULSEWIDTH: 0.2 ms
MDC IDC STAT BRADY AP VS PERCENT: 0 %
MDC IDC STAT BRADY AS VP PERCENT: 99.9 %
MDC IDC STAT BRADY AS VS PERCENT: 0.1 % — AB
MDC IDC STAT BRADY RV PERCENT PACED: 99.9 %

## 2015-08-01 NOTE — Progress Notes (Signed)
Pacemaker check in clinic. Normal device function. Thresholds, sensing, impedances consistent with previous measurements. Device programmed to maximize longevity. 17 AMS, max atrial 265, longest 62sec. No ventricular high rates. Device programmed at appropriate safety margins. Histogram distribution appropriate for patient activity level. Device programmed to optimize intrinsic conduction. Estimated longevity < 1-27 months, Avg: 26months. ROV with device clinic 10/03/2015. Patient education completed.

## 2015-08-08 ENCOUNTER — Encounter: Payer: Self-pay | Admitting: Podiatry

## 2015-08-08 ENCOUNTER — Ambulatory Visit (INDEPENDENT_AMBULATORY_CARE_PROVIDER_SITE_OTHER): Payer: Medicare Other | Admitting: Podiatry

## 2015-08-08 DIAGNOSIS — M79609 Pain in unspecified limb: Principal | ICD-10-CM

## 2015-08-08 DIAGNOSIS — B351 Tinea unguium: Secondary | ICD-10-CM | POA: Diagnosis not present

## 2015-08-08 DIAGNOSIS — M79673 Pain in unspecified foot: Secondary | ICD-10-CM

## 2015-08-08 NOTE — Progress Notes (Addendum)
   Subjective:    Patient ID: Gloria Gonzalez, female    DOB: October 26, 1921, 82 y.o.   MRN: [id number]  HPI  Patient presents here today for a B/L debridement. She says she has moved here from [place] area for nail care.  She says she has been seeing a podiatrist who regularly treats her fungus nails. She says her nails have grown long and are painful walking and wearing her shoes. She presents for preventive foot care services.  Review of Systems  Cardiovascular: Positive for leg swelling.  Musculoskeletal:       Joint pain  All other systems reviewed and are negative.      Objective:   Physical Exam GENERAL APPEARANCE: Alert, conversant. Appropriately groomed. No acute distress.  VASCULAR: Pedal pulses are not  palpable at  [facility] and PT bilateral.  Capillary refill time is immediate to all digits,  Cold feet noted.   NEUROLOGIC: sensation is normal to 5.07 monofilament at 5/5 sites bilateral.  Light touch is intact bilateral, Muscle strength normal.  MUSCULOSKELETAL: acceptable muscle strength, tone and stability bilateral.  Intrinsic muscluature intact bilateral.  Severe HAV deformity B/L  DERMATOLOGIC: skin color, texture, and turgor are within normal limits.  No preulcerative lesions or ulcers  are seen, no interdigital maceration noted.  No open lesions present. . No drainage noted.  NAILS  Thick disfigured discolored nails  Hallux B/l         Assessment & Plan:  Onychomycosis  B/L  IE  Debridement of long thick disfigured discolored nails both feet.  RTC 3 months.   Gardiner Barefoot DPM

## 2015-10-03 ENCOUNTER — Ambulatory Visit (INDEPENDENT_AMBULATORY_CARE_PROVIDER_SITE_OTHER): Payer: Medicare Other | Admitting: *Deleted

## 2015-10-03 ENCOUNTER — Encounter: Payer: Self-pay | Admitting: Cardiology

## 2015-10-03 DIAGNOSIS — Z950 Presence of cardiac pacemaker: Secondary | ICD-10-CM

## 2015-10-03 LAB — CUP PACEART INCLINIC DEVICE CHECK
Implantable Lead Location: 753859
Implantable Lead Model: 4092
Implantable Lead Model: 4592
Lead Channel Setting Sensing Sensitivity: 2.8 mV
MDC IDC LEAD IMPLANT DT: 20031112
MDC IDC LEAD IMPLANT DT: 20031112
MDC IDC LEAD LOCATION: 753860
MDC IDC SESS DTM: 20170706142742
MDC IDC SET LEADCHNL RA PACING AMPLITUDE: 2 V
MDC IDC SET LEADCHNL RV PACING AMPLITUDE: 1.5 V
MDC IDC SET LEADCHNL RV PACING PULSEWIDTH: 0.2 ms

## 2015-10-03 NOTE — Progress Notes (Signed)
Battery check in clinic. Longevity: 12 month average with a range of <1-24 months. 10 AHR episodes- all < 1 min, AT. ROV with device clinic 11/21/15 and with WC in October.

## 2015-10-10 ENCOUNTER — Other Ambulatory Visit: Payer: Self-pay | Admitting: Geriatric Medicine

## 2015-10-10 ENCOUNTER — Telehealth: Payer: Self-pay | Admitting: Internal Medicine

## 2015-10-10 MED ORDER — ROC RETINOL CORREXION EX CREA: TOPICAL_CREAM | CUTANEOUS | Status: DC

## 2015-10-10 NOTE — Telephone Encounter (Signed)
Sent to [facility].

## 2015-10-10 NOTE — Telephone Encounter (Signed)
States Dr. Sharlet Salina sent script for emollient to [facility].  States [facility] does not have this medication.  Is requesting script to be sent over to [place] [facility] [place]

## 2015-10-11 ENCOUNTER — Telehealth: Payer: Self-pay

## 2015-10-11 DIAGNOSIS — L989 Disorder of the skin and subcutaneous tissue, unspecified: Secondary | ICD-10-CM

## 2015-10-11 NOTE — Telephone Encounter (Signed)
Patient would a referral to see a dermatologist. Please follow up, Thank you!

## 2015-10-14 NOTE — Telephone Encounter (Signed)
Left message for patient to call back to let me know what symptoms she is having before referral is placed.

## 2015-10-25 NOTE — Telephone Encounter (Signed)
Referral placed.

## 2015-10-25 NOTE — Telephone Encounter (Signed)
Patient called back and stated that she has sores on her forehead that will not heal. That is why she would like to see a dermatologist. Please follow up, Thank you.

## 2015-10-25 NOTE — Addendum Note (Signed)
Addended by: Pricilla Holm A on: 10/25/2015 04:33 PM   Modules accepted: Orders

## 2015-10-29 DIAGNOSIS — H401133 Primary open-angle glaucoma, bilateral, severe stage: Secondary | ICD-10-CM | POA: Diagnosis not present

## 2015-10-29 DIAGNOSIS — D3142 Benign neoplasm of left ciliary body: Secondary | ICD-10-CM | POA: Diagnosis not present

## 2015-10-29 DIAGNOSIS — H04123 Dry eye syndrome of bilateral lacrimal glands: Secondary | ICD-10-CM | POA: Diagnosis not present

## 2015-10-29 DIAGNOSIS — H353131 Nonexudative age-related macular degeneration, bilateral, early dry stage: Secondary | ICD-10-CM | POA: Diagnosis not present

## 2015-10-31 ENCOUNTER — Ambulatory Visit: Payer: Medicare Other | Admitting: Podiatry

## 2015-11-21 ENCOUNTER — Ambulatory Visit (INDEPENDENT_AMBULATORY_CARE_PROVIDER_SITE_OTHER): Payer: Medicare Other | Admitting: *Deleted

## 2015-11-21 ENCOUNTER — Encounter: Payer: Self-pay | Admitting: Cardiology

## 2015-11-21 DIAGNOSIS — I442 Atrioventricular block, complete: Secondary | ICD-10-CM

## 2015-11-21 DIAGNOSIS — Z950 Presence of cardiac pacemaker: Secondary | ICD-10-CM | POA: Diagnosis not present

## 2015-11-21 LAB — CUP PACEART INCLINIC DEVICE CHECK
Brady Statistic AP VP Percent: 0.3 %
Brady Statistic AS VP Percent: 99.7 %
Date Time Interrogation Session: 20170824144947
Implantable Lead Location: 753859
Implantable Lead Location: 753860
Implantable Lead Model: 4092
Implantable Lead Model: 4592
Lead Channel Pacing Threshold Amplitude: 0.5 V
Lead Channel Pacing Threshold Pulse Width: 0.24 ms
Lead Channel Sensing Intrinsic Amplitude: 2 mV
Lead Channel Setting Pacing Amplitude: 2 V
Lead Channel Setting Pacing Pulse Width: 0.2 ms
MDC IDC LEAD IMPLANT DT: 20031112
MDC IDC LEAD IMPLANT DT: 20031112
MDC IDC MSMT LEADCHNL RA IMPEDANCE VALUE: 611 Ohm
MDC IDC MSMT LEADCHNL RA PACING THRESHOLD AMPLITUDE: 0.5 V
MDC IDC MSMT LEADCHNL RV IMPEDANCE VALUE: 1032 Ohm
MDC IDC MSMT LEADCHNL RV PACING THRESHOLD PULSEWIDTH: 0.21 ms
MDC IDC SET LEADCHNL RV PACING AMPLITUDE: 1.5 V
MDC IDC SET LEADCHNL RV SENSING SENSITIVITY: 2.8 mV
MDC IDC STAT BRADY AP VS PERCENT: 0.1 % — AB
MDC IDC STAT BRADY AS VS PERCENT: 0.1 % — AB

## 2015-11-21 NOTE — Progress Notes (Signed)
Pacemaker check in clinic. Normal device function. Thresholds, sensing, impedances consistent with previous measurements. Device programmed to maximize longevity. 7 mode switches- longest 1:13, markers only, A rate 334bpm. No high ventricular rates noted. Device programmed at appropriate safety margins. Histogram distribution appropriate for patient activity level. Device programmed to optimize intrinsic conduction. Estimated longevity <1-23 months with an average of 11 months. ROV with WC 01/07/16 at 2:30pm.

## 2015-11-25 DIAGNOSIS — Z85828 Personal history of other malignant neoplasm of skin: Secondary | ICD-10-CM | POA: Diagnosis not present

## 2015-11-25 DIAGNOSIS — D485 Neoplasm of uncertain behavior of skin: Secondary | ICD-10-CM | POA: Diagnosis not present

## 2015-11-28 DIAGNOSIS — D0439 Carcinoma in situ of skin of other parts of face: Secondary | ICD-10-CM | POA: Diagnosis not present

## 2015-11-28 DIAGNOSIS — L570 Actinic keratosis: Secondary | ICD-10-CM | POA: Diagnosis not present

## 2015-11-28 DIAGNOSIS — C44319 Basal cell carcinoma of skin of other parts of face: Secondary | ICD-10-CM | POA: Diagnosis not present

## 2015-11-28 DIAGNOSIS — L821 Other seborrheic keratosis: Secondary | ICD-10-CM | POA: Diagnosis not present

## 2015-12-20 ENCOUNTER — Encounter: Payer: Self-pay | Admitting: Cardiology

## 2015-12-24 ENCOUNTER — Telehealth: Payer: Self-pay

## 2015-12-24 NOTE — Telephone Encounter (Signed)
Patient called in saying when she comes in on Thursday the 28th. She wanted to know if we could have her xray to show her what her knees look like. Just an FYI. Thank you.

## 2015-12-26 ENCOUNTER — Encounter: Payer: Self-pay | Admitting: Internal Medicine

## 2015-12-26 ENCOUNTER — Ambulatory Visit (INDEPENDENT_AMBULATORY_CARE_PROVIDER_SITE_OTHER): Payer: Medicare Other | Admitting: Internal Medicine

## 2015-12-26 VITALS — BP 138/72 | HR 94 | Temp 98.3°F | Resp 14 | Ht <= 58 in | Wt 110.0 lb

## 2015-12-26 DIAGNOSIS — M159 Polyosteoarthritis, unspecified: Secondary | ICD-10-CM | POA: Insufficient documentation

## 2015-12-26 DIAGNOSIS — Z23 Encounter for immunization: Secondary | ICD-10-CM

## 2015-12-26 DIAGNOSIS — M150 Primary generalized (osteo)arthritis: Secondary | ICD-10-CM | POA: Diagnosis not present

## 2015-12-26 NOTE — Addendum Note (Signed)
Addended by: Resa Miner R on: 12/26/2015 02:40 PM   Modules accepted: Orders

## 2015-12-26 NOTE — Assessment & Plan Note (Signed)
Referral to orthopedic surgery for help with her mobility and low back and knee arthritis. No imaging today. Using tylenol prn if needed (rare).

## 2015-12-26 NOTE — Progress Notes (Signed)
   Subjective:    Patient ID: Gloria Gonzalez, female    DOB: 1921/04/09, 82 y.o.   MRN: [id number]  HPI The patient is a 82 YO female coming in for arthritis. She does have severe arthritis in her knees and low back that make it hard for her to walk well. She would like to see a specialist to see if she could walk better. She denies significant pain in her knees but is having pain in the low back with walking.   Review of Systems  Constitutional: Negative for activity change, appetite change, fatigue, fever and unexpected weight change.  Respiratory: Negative for cough, chest tightness, shortness of breath and wheezing.   Cardiovascular: Negative for chest pain, palpitations and leg swelling.  Gastrointestinal: Negative for abdominal distention, abdominal pain, constipation and diarrhea.  Musculoskeletal: Positive for arthralgias and gait problem.  Skin: Negative.       Objective:   Physical Exam  Constitutional: She is oriented to person, place, and time. She appears well-developed and well-nourished.  HENT:  Head: Normocephalic and atraumatic.  Eyes: EOM are normal.  Neck: Normal range of motion.  Cardiovascular: Normal rate.   Pulmonary/Chest: Effort normal and breath sounds normal. No respiratory distress. She has no wheezes. She has no rales.  Abdominal: Soft. Bowel sounds are normal. She exhibits no distension. There is no tenderness. There is no rebound.  Musculoskeletal: She exhibits no edema.  Neurological: She is alert and oriented to person, place, and time. Coordination normal.  Skin: Skin is warm and dry.   Vitals:   12/26/15 1335  BP: 138/72  Pulse: 94  Resp: 14  Temp: 98.3 F (36.8 C)  TempSrc: Oral  SpO2: 97%  Weight: 110 lb (49.9 kg)  Height: 4\' 10"  (1.473 m)      Assessment & Plan:  Flu shot and tetanus given at visit.

## 2015-12-26 NOTE — Progress Notes (Signed)
Pre visit review using our clinic review tool, if applicable. No additional management support is needed unless otherwise documented below in the visit note. 

## 2015-12-26 NOTE — Patient Instructions (Addendum)
We have put in the referral for the arthritis specialist for them to look at the low back and knees.   Rogaine 5% is the same as the prescription strength.

## 2016-01-07 ENCOUNTER — Encounter (INDEPENDENT_AMBULATORY_CARE_PROVIDER_SITE_OTHER): Payer: Self-pay

## 2016-01-07 ENCOUNTER — Ambulatory Visit (INDEPENDENT_AMBULATORY_CARE_PROVIDER_SITE_OTHER): Payer: Medicare Other | Admitting: Cardiology

## 2016-01-07 ENCOUNTER — Encounter: Payer: Self-pay | Admitting: Cardiology

## 2016-01-07 VITALS — BP 100/58 | HR 85 | Ht <= 58 in | Wt 111.8 lb

## 2016-01-07 DIAGNOSIS — I442 Atrioventricular block, complete: Secondary | ICD-10-CM | POA: Diagnosis not present

## 2016-01-07 DIAGNOSIS — Z950 Presence of cardiac pacemaker: Secondary | ICD-10-CM | POA: Diagnosis not present

## 2016-01-07 LAB — CUP PACEART INCLINIC DEVICE CHECK
Implantable Lead Implant Date: 20031112
Implantable Lead Location: 753859
Implantable Lead Location: 753860
Implantable Lead Model: 4092
Lead Channel Impedance Value: 1107 Ohm
Lead Channel Impedance Value: 654 Ohm
Lead Channel Pacing Threshold Amplitude: 0.5 V
Lead Channel Pacing Threshold Amplitude: 2 V
Lead Channel Pacing Threshold Pulse Width: 0.03 ms
Lead Channel Pacing Threshold Pulse Width: 0.2 ms
Lead Channel Pacing Threshold Pulse Width: 0.25 ms
Lead Channel Setting Pacing Amplitude: 2 V
Lead Channel Setting Pacing Pulse Width: 0.2 ms
Lead Channel Setting Sensing Sensitivity: 2.8 mV
MDC IDC LEAD IMPLANT DT: 20031112
MDC IDC LEAD MODEL: 4592
MDC IDC MSMT BATTERY IMPEDANCE: 5483 Ohm
MDC IDC MSMT BATTERY REMAINING LONGEVITY: 9 mo
MDC IDC MSMT BATTERY VOLTAGE: 2.7 V
MDC IDC MSMT LEADCHNL RA PACING THRESHOLD AMPLITUDE: 1 V
MDC IDC MSMT LEADCHNL RA SENSING INTR AMPL: 2 mV
MDC IDC SESS DTM: 20171010161830
MDC IDC SET LEADCHNL RV PACING AMPLITUDE: 1.5 V

## 2016-01-07 NOTE — Progress Notes (Signed)
Electrophysiology Office Note   Date:  01/07/2016   ID:  Gloria Gonzalez, DOB 1921-04-24, MRN [id number]  PCP:  Hoyt Koch, MD   Primary Electrophysiologist:  Charyl Minervini Meredith Leeds, MD    Chief Complaint  Patient presents with  . Pacemaker Check    complete heart block     History of Present Illness: Gloria Gonzalez is a 82 y.o. female who presents today for electrophysiology evaluation.   She has a pacemaker which was placed in 2003.  The device was implanted for an irregular rhythm, likely some sort of heart block.      Today, she denies symptoms of palpitations, chest pain, shortness of breath, orthopnea, PND, lower extremity edema, claudication, dizziness, presyncope, syncope, bleeding, or neurologic sequela. The patient is tolerating medications without difficulties and is otherwise without complaint today. She is feeling well today without major complaint. She has been working with physical therapy and has been doing well. She is working on walking without a walker. With her therapy, she has gotten 2 inches taller, as she has a much less pronounced lump in her back with a straighter spine.   Past Medical History:  Diagnosis Date  . Arrhythmia   . Arthritis   . Glaucoma   . Hyperlipidemia   . Thyroid disease    Past Surgical History:  Procedure Laterality Date  . EYE SURGERY    . PACEMAKER INSERTION  2003     Current Outpatient Prescriptions  Medication Sig Dispense Refill  . atorvastatin (LIPITOR) 20 MG tablet Take 1 tablet (20 mg total) by mouth daily at 6 PM. 180 tablet 3  . Calcium Citrate-Vitamin D (CALCIUM + D PO) Take 1 tablet by mouth daily.    . Emollient (ROC RETINOL CORREXION) CREA Apply topically 30 mL 11  . omega-3 acid ethyl esters (LOVAZA) 1 g capsule Take 1 capsule (1 g total) by mouth 2 (two) times daily. 180 capsule 3  . raloxifene (EVISTA) 60 MG tablet Take 1 tablet (60 mg total) by mouth daily. 90 tablet 3  . SYNTHROID 50 MCG tablet  Take 1 tablet (50 mcg total) by mouth daily before breakfast. 90 tablet 3   No current facility-administered medications for this visit.     Allergies:   Review of patient's allergies indicates no known allergies.   Social History:  The patient  reports that she has never smoked. She has never used smokeless tobacco. She reports that she does not drink alcohol or use drugs.   Family History:  [name] family history includes Arthritis in her mother; Heart disease in her brother and sister.    ROS:  Please see the history of present illness.   All other systems are reviewed and negative.    PHYSICAL EXAM: VS:  BP (!) 100/58   Pulse 85   Ht 4\' 10"  (1.473 m)   Wt 111 lb 12.8 oz (50.7 kg)   BMI 23.37 kg/m  , BMI Body mass index is 23.37 kg/m. GEN: Well nourished, well developed, in no acute distress  HEENT: normal  Neck: no JVD, carotid bruits, or masses Cardiac: RRR; no murmurs, rubs, or gallops,no edema  Respiratory:  clear to auscultation bilaterally, normal work of breathing GI: soft, nontender, nondistended, + BS MS: no deformity or atrophy  Skin: warm and dry, device pocket is well healed Neuro:  Strength and sensation are intact Psych: euthymic mood, full affect  EKG:  EKG is ordered today. Personal review of the ekg ordered today  shows A sense, V pace  Device interrogation is reviewed today in detail.  See PaceArt for details.    Recent Labs: 06/21/2015: ALT 11; BUN 34; Creatinine, Ser 1.06; Hemoglobin 13.7; Platelets 283.0; Potassium 4.3; Sodium 138; TSH 18.86    Lipid Panel  No results found for: CHOL, TRIG, HDL, CHOLHDL, VLDL, LDLCALC, LDLDIRECT   Wt Readings from Last 3 Encounters:  01/07/16 111 lb 12.8 oz (50.7 kg)  12/26/15 110 lb (49.9 kg)  06/21/15 112 lb (50.8 kg)   ASSESSMENT AND PLAN:  1.  Complete heart block: The patient does not know the reason for the implant currently.  100% V paced currently.  Battery status is good, and she does not  need replacement at this time. We'll continue to monitor.  Follow up in device clinic 2 months for battery check.  2. Hyperlipidemia: continue atorvastatinUR  Current medicines are reviewed at length with the patient today.   The patient does not have concerns regarding her medicines.  The following changes were made today:  none  Labs/ tests ordered today include: none  No orders of the defined types were placed in this encounter.    Disposition:   FU with Keaghan Bowens 1 year  Signed, Lithzy Bernard Meredith Leeds, MD  01/07/2016 3:36 PM     [address] [place] [address] [phone] (office) [phone] (fax)

## 2016-01-07 NOTE — Patient Instructions (Signed)
Medication Instructions:    Your physician recommends that you continue on your current medications as directed. Please refer to the Current Medication list given to you today.  --- If you need a refill on your cardiac medications before your next appointment, please call your pharmacy. ---  Labwork:  None ordered  Testing/Procedures:  None ordered  Follow-Up:  Your physician recommends that you schedule a follow-up appointment in: 2 months with device clinic for battery check.   Thank you for choosing [facility]!!   Trinidad Curet, RN [phone]

## 2016-01-14 DIAGNOSIS — M170 Bilateral primary osteoarthritis of knee: Secondary | ICD-10-CM | POA: Diagnosis not present

## 2016-01-15 DIAGNOSIS — C44319 Basal cell carcinoma of skin of other parts of face: Secondary | ICD-10-CM | POA: Diagnosis not present

## 2016-01-15 DIAGNOSIS — C44329 Squamous cell carcinoma of skin of other parts of face: Secondary | ICD-10-CM | POA: Diagnosis not present

## 2016-02-13 DIAGNOSIS — D1801 Hemangioma of skin and subcutaneous tissue: Secondary | ICD-10-CM | POA: Diagnosis not present

## 2016-02-13 DIAGNOSIS — Z85828 Personal history of other malignant neoplasm of skin: Secondary | ICD-10-CM | POA: Diagnosis not present

## 2016-02-13 DIAGNOSIS — C44329 Squamous cell carcinoma of skin of other parts of face: Secondary | ICD-10-CM | POA: Diagnosis not present

## 2016-02-13 DIAGNOSIS — Z23 Encounter for immunization: Secondary | ICD-10-CM | POA: Diagnosis not present

## 2016-02-13 DIAGNOSIS — L814 Other melanin hyperpigmentation: Secondary | ICD-10-CM | POA: Diagnosis not present

## 2016-02-13 DIAGNOSIS — L570 Actinic keratosis: Secondary | ICD-10-CM | POA: Diagnosis not present

## 2016-02-13 DIAGNOSIS — L821 Other seborrheic keratosis: Secondary | ICD-10-CM | POA: Diagnosis not present

## 2016-03-18 DIAGNOSIS — L570 Actinic keratosis: Secondary | ICD-10-CM | POA: Diagnosis not present

## 2016-03-19 ENCOUNTER — Ambulatory Visit: Payer: Medicare Other | Admitting: *Deleted

## 2016-03-19 DIAGNOSIS — Z950 Presence of cardiac pacemaker: Secondary | ICD-10-CM

## 2016-03-19 NOTE — Progress Notes (Signed)
Battery check only.  Estimated longevity <53mo-25mo  ROV with DC 2/22

## 2016-03-30 DIAGNOSIS — C4490 Unspecified malignant neoplasm of skin, unspecified: Secondary | ICD-10-CM

## 2016-03-30 HISTORY — DX: Unspecified malignant neoplasm of skin, unspecified: C44.90

## 2016-03-30 HISTORY — PX: SKIN CANCER EXCISION: SHX779

## 2016-04-22 ENCOUNTER — Telehealth: Payer: Self-pay | Admitting: Nurse Practitioner

## 2016-04-22 DIAGNOSIS — Z8679 Personal history of other diseases of the circulatory system: Secondary | ICD-10-CM

## 2016-04-22 DIAGNOSIS — I351 Nonrheumatic aortic (valve) insufficiency: Secondary | ICD-10-CM

## 2016-04-22 DIAGNOSIS — I050 Rheumatic mitral stenosis: Secondary | ICD-10-CM

## 2016-04-22 NOTE — Telephone Encounter (Signed)
I called patient after reviewing Dr. Elmarie Shiley schedule to ask her if she knew why she had been scheduled to see him since she is already an established patient of Dr. Curt Bears for complete heart block and pacemaker. She states she told Dr. Curt Bears that she had a valve problem several years ago in [place] and he advised her to set up an appointment with a general cardiologist. Due to the patient's age and difficulty getting to appointments, I advised that I could send a note to Dr. Curt Bears to request that he order an echo for evaluation of her heart valves. I am routing message to Dr. Acie Fredrickson, and Dr. Curt Bears and his primary nurse, Trinidad Curet, RN for awareness. Patient understands that someone from our office will call her back to advise her on next steps. I have cancelled the appointment for tomorrow, Jan. 25, due to the patient stating this is not an urgent issue.

## 2016-04-22 NOTE — Telephone Encounter (Signed)
Gloria Gonzalez, Given her age of 33, do you think if would be OK to order an echo and review it before we schedule her for a consult  I can review and see if there is anything that needs to be addressed.  Thanks  [employer]

## 2016-04-23 ENCOUNTER — Ambulatory Visit: Payer: Medicare Other | Admitting: Cardiovascular Disease

## 2016-04-23 NOTE — Telephone Encounter (Signed)
Spoke with Trinidad Curet, RN who states Dr. Curt Bears agrees to order echocardiogram.  She states we will see if results warrant referral to general cardiologist.  I called and spoke with patient and reviewed this information with her. I advised someone from our office will call her to schedule the echo. She verbalized understanding and agreement and thanked me for the call.

## 2016-04-23 NOTE — Telephone Encounter (Signed)
New message   Pt verbalized that she is calling for Dr.Nashers rn about why the appt was canceled and if she can schedule with him.

## 2016-04-23 NOTE — Addendum Note (Signed)
Addended by: Stanton Kidney on: 04/23/2016 11:35 AM   Modules accepted: Orders

## 2016-05-01 DIAGNOSIS — H401133 Primary open-angle glaucoma, bilateral, severe stage: Secondary | ICD-10-CM | POA: Diagnosis not present

## 2016-05-01 DIAGNOSIS — H353131 Nonexudative age-related macular degeneration, bilateral, early dry stage: Secondary | ICD-10-CM | POA: Diagnosis not present

## 2016-05-01 DIAGNOSIS — H04123 Dry eye syndrome of bilateral lacrimal glands: Secondary | ICD-10-CM | POA: Diagnosis not present

## 2016-05-01 DIAGNOSIS — H3561 Retinal hemorrhage, right eye: Secondary | ICD-10-CM | POA: Diagnosis not present

## 2016-05-01 DIAGNOSIS — D3142 Benign neoplasm of left ciliary body: Secondary | ICD-10-CM | POA: Diagnosis not present

## 2016-05-18 DIAGNOSIS — L570 Actinic keratosis: Secondary | ICD-10-CM | POA: Diagnosis not present

## 2016-05-18 DIAGNOSIS — Z23 Encounter for immunization: Secondary | ICD-10-CM | POA: Diagnosis not present

## 2016-05-18 DIAGNOSIS — L814 Other melanin hyperpigmentation: Secondary | ICD-10-CM | POA: Diagnosis not present

## 2016-05-18 DIAGNOSIS — C44329 Squamous cell carcinoma of skin of other parts of face: Secondary | ICD-10-CM | POA: Diagnosis not present

## 2016-05-18 DIAGNOSIS — L98491 Non-pressure chronic ulcer of skin of other sites limited to breakdown of skin: Secondary | ICD-10-CM | POA: Diagnosis not present

## 2016-05-18 DIAGNOSIS — L821 Other seborrheic keratosis: Secondary | ICD-10-CM | POA: Diagnosis not present

## 2016-05-18 DIAGNOSIS — Z85828 Personal history of other malignant neoplasm of skin: Secondary | ICD-10-CM | POA: Diagnosis not present

## 2016-05-18 DIAGNOSIS — D1801 Hemangioma of skin and subcutaneous tissue: Secondary | ICD-10-CM | POA: Diagnosis not present

## 2016-05-19 ENCOUNTER — Other Ambulatory Visit: Payer: Self-pay | Admitting: Cardiology

## 2016-05-21 ENCOUNTER — Encounter (INDEPENDENT_AMBULATORY_CARE_PROVIDER_SITE_OTHER): Payer: Self-pay

## 2016-05-21 ENCOUNTER — Ambulatory Visit (INDEPENDENT_AMBULATORY_CARE_PROVIDER_SITE_OTHER): Payer: Medicare Other | Admitting: *Deleted

## 2016-05-21 DIAGNOSIS — I442 Atrioventricular block, complete: Secondary | ICD-10-CM

## 2016-05-21 NOTE — Patient Instructions (Signed)
Battery Check only  <76mo -77mo remaining.  ROV with DC 4/26

## 2016-06-02 ENCOUNTER — Other Ambulatory Visit (HOSPITAL_COMMUNITY): Payer: Medicare Other

## 2016-06-08 ENCOUNTER — Other Ambulatory Visit (HOSPITAL_COMMUNITY): Payer: Medicare Other

## 2016-06-10 ENCOUNTER — Encounter: Payer: Self-pay | Admitting: Radiation Oncology

## 2016-06-12 ENCOUNTER — Telehealth: Payer: Self-pay | Admitting: Internal Medicine

## 2016-06-12 NOTE — Telephone Encounter (Signed)
PLEASE NOTE: All timestamps contained within this report are represented as [place] Standard Time. CONFIDENTIALTY NOTICE: This fax transmission is intended only for the addressee. It contains information that is legally privileged, confidential or otherwise protected from use or disclosure. If you are not the intended recipient, you are strictly prohibited from reviewing, disclosing, copying using or disseminating any of this information or taking any action in reliance on or regarding this information. If you have received this fax in error, please notify us immediately by telephone so that we can arrange for its return to [place]. Phone: [phone], Toll-Free: [phone], Fax: [phone] Page: 1 of 1 Call Id: [id number] [facility] - Client [place] Patient Name: Gloria Gonzalez DOB: 01-11-1922 Initial Comment caller states her urine is a muddy color Nurse Assessment Nurse: Dimas Chyle, RN, Dellis Filbert Date/Time Eilene Ghazi Time): 06/12/2016 12:59:27 PM Confirm and document reason for call. If symptomatic, describe symptoms. ---Caller states her urine is a muddy color. Symptoms started a few days ago. No painful urination, no increased frequency, and no fever. No flank pain. Does the patient have any new or worsening symptoms? ---Yes Will a triage be completed? ---Yes Related visit to physician within the last 2 weeks? ---No Does the PT have any chronic conditions? (i.e. diabetes, asthma, etc.) ---Yes List chronic conditions. ---Pacemaker, arthritis, hx of skin cancer Is this a behavioral health or substance abuse call? ---No Guidelines Guideline Title Affirmed Question Affirmed Notes Urinary Symptoms All other urine symptoms Final Disposition User See PCP within 2 Unk Pinto, RN, Dellis Filbert Disagree/Comply: Comply

## 2016-06-13 ENCOUNTER — Encounter: Payer: Self-pay | Admitting: Family Medicine

## 2016-06-13 ENCOUNTER — Other Ambulatory Visit (HOSPITAL_COMMUNITY)
Admission: RE | Admit: 2016-06-13 | Discharge: 2016-06-13 | Disposition: A | Discharge status: Home / Self Care | Payer: Medicare Other | Source: Other Acute Inpatient Hospital | Attending: Family Medicine | Admitting: Family Medicine

## 2016-06-13 ENCOUNTER — Ambulatory Visit (INDEPENDENT_AMBULATORY_CARE_PROVIDER_SITE_OTHER): Payer: Medicare Other | Admitting: Family Medicine

## 2016-06-13 VITALS — BP 100/58 | HR 84 | Temp 97.6°F | Ht <= 58 in | Wt 112.5 lb

## 2016-06-13 DIAGNOSIS — R3989 Other symptoms and signs involving the genitourinary system: Secondary | ICD-10-CM | POA: Insufficient documentation

## 2016-06-13 LAB — POC URINALSYSI DIPSTICK (AUTOMATED)
Bilirubin, UA: NEGATIVE
Glucose, UA: NEGATIVE
Ketones, UA: NEGATIVE
Leukocytes, UA: NEGATIVE
NITRITE UA: NEGATIVE
PROTEIN UA: NEGATIVE
RBC UA: 1
SPEC GRAV UA: 1.03 (ref 1.030–1.035)
UROBILINOGEN UA: 0.2 (ref ?–2.0)
pH, UA: 5 (ref 5.0–8.0)

## 2016-06-13 MED ORDER — CEPHALEXIN 500 MG PO CAPS: 500.0000 mg | ORAL_CAPSULE | Freq: Three times a day (TID) | ORAL | 0 refills | Status: DC

## 2016-06-13 NOTE — Patient Instructions (Addendum)
I want to drink more water.  If you get a fever, abdominal pain, lower back pain or can not produce urine please be seen in the ED. We will call you with the culture result once we have it.

## 2016-06-13 NOTE — Progress Notes (Signed)
Gloria Gonzalez , 1921/08/04, 83 y.o., female MRN: [id number] Patient Care Team    Relationship Specialty Notifications Start End  Gloria Koch, MD PCP - General Internal Medicine  12/21/14     CC: abnormal urine color Subjective: Pt presents for an  OV with complaints of abnl urine color of 2-3 days duration.  Associated symptoms  Incontinence (chronic) and "muddy" urine. She reports muddy urine 3 days ago and it has continued intermittently with normal urine color between. This morning her urine was "just cloudy". She reports her urine was light green yesterday, but they did have asparagus for dinner. She denies nausea, vomit, abd pain, low back pain or dizziness. She states she feels just fine.She states she normally drinks a good deal of water. She does not kidney issues and last GFR was ~50. She has not had a recent illlnes and she has never had a uti in the past. She denies h/o kidney stones.    Depression screen PHQ 2/9 06/21/2015  Decreased Interest 0  Down, Depressed, Hopeless 0  PHQ - 2 Score 0    No Known Allergies Social History  Substance Use Topics  . Smoking status: Never Smoker  . Smokeless tobacco: Never Used  . Alcohol use No   Past Medical History:  Diagnosis Date  . Arrhythmia   . Arthritis   . Glaucoma   . Hyperlipidemia   . Thyroid disease    Past Surgical History:  Procedure Laterality Date  . EYE SURGERY    . PACEMAKER INSERTION  2003   Family History  Problem Relation Age of Onset  . Arthritis Mother   . Heart disease Sister   . Heart disease Brother    Allergies as of 06/13/2016   No Known Allergies     Medication List       Accurate as of 06/13/16  9:11 AM. Always use your most recent med list.          atorvastatin 20 MG tablet Commonly known as:  LIPITOR Take 1 tablet (20 mg total) by mouth daily at 6 PM.   CALCIUM + D PO Take 1 tablet by mouth daily.   omega-3 acid ethyl esters 1 g capsule Commonly known as:   LOVAZA Take 1 capsule (1 g total) by mouth 2 (two) times daily.   raloxifene 60 MG tablet Commonly known as:  EVISTA Take 1 tablet (60 mg total) by mouth daily.   ROC RETINOL CORREXION Crea Apply topically   SYNTHROID 50 MCG tablet Generic drug:  levothyroxine Take 1 tablet (50 mcg total) by mouth daily before breakfast.       No results found for this or any previous visit (from the past 24 hour(s)). No results found.   ROS: Negative, with the exception of above mentioned in HPI   Objective:  BP (!) 100/58 (BP Location: Left Arm, Patient Position: Sitting, Cuff Size: Normal)   Pulse 84   Temp 97.6 F (36.4 C) (Oral)   Ht 4\' 10"  (1.473 m)   Wt 112 lb 8 oz (51 kg)   SpO2 98%   BMI 23.51 kg/m  Body mass index is 23.51 kg/m. Gen: Afebrile. No acute distress. Nontoxic in appearance, well developed, well nourished.  HENT: AT. [place].  MMM Eyes:Pupils Equal Round Reactive to light, Extraocular movements intact,  Conjunctiva without redness, discharge or icterus.  CV: RRR  Chest: CTAB, no wheeze or crackles.  Abd: Soft. NTND. BS present.  MSK: No CVA tenderness  Skin: no rashes, purpura or petechiae.  Neuro: In wheelchair (her normal)t. PERLA. EOMi. Alert. Oriented x3  Assessment/Plan: Gloria Gonzalez is a 83 y.o. female present for OV for  Abnormal urine [place] - pt is completely asymptomatic by report. BP is at her baseline.  - discussed increasing fluids. - will treat as UTI and send culture.  - POCT Urinalysis Dipstick (Automated) - Urine Culture - F/U with PCP within 1 week.   Reviewed expectations re: course of current medical issues.  Discussed self-management of symptoms.  Outlined signs and symptoms indicating need for more acute intervention.  Patient verbalized understanding and all questions were answered.  Patient received an After-Visit Summary.   electronically signed by:  Howard Pouch, DO  [place]

## 2016-06-13 NOTE — Progress Notes (Signed)
Pre visit review using our clinic review tool, if applicable. No additional management support is needed unless otherwise documented below in the visit note. 

## 2016-06-14 LAB — URINE CULTURE: CULTURE: NO GROWTH

## 2016-06-15 ENCOUNTER — Telehealth: Payer: Self-pay | Admitting: Family Medicine

## 2016-06-15 NOTE — Telephone Encounter (Signed)
Spoke with patient daughter Joycelyn Schmid reviewed lab results and instructions for follow up with primary . Continue current antibiotic. Patient daughter verbalized understanding.

## 2016-06-15 NOTE — Telephone Encounter (Signed)
Please call pt (or her contact): - she was seen in Saturday clinic, her urine culture was negative for bacteria. She did have blood in her urine. I would recommend she be seen by her primary care physician in the next couple weeks to get her retested to make certain condition has resolved. If she is still noticing obvious urinary color change or systemic symptoms (fever, chills, pain etc) I would recommend she be seen by her PCP sooner.  - She can continue the abx to be precautious

## 2016-06-16 ENCOUNTER — Other Ambulatory Visit: Payer: Self-pay | Admitting: Cardiology

## 2016-06-16 NOTE — Progress Notes (Signed)
Histology and Location of Primary Skin Cancer: squamous cell carcinoma  Right mid brow  Gloria Gonzalez presented with the following signs/symptoms,   months ago:   Past/Anticipated interventions by patient's surgeon/dermatologist for current problematic lesion, if any:   Past skin cancers, if any:  1) Location/Histology/Intervention: 04/29/16 wound care with Dr. Danielle Dess, Skin Surgery  Last seen 06/11/16 follow up and d/c from his care  02/19/16: follow up  Repair adv flap mid superior brow suture removal, [facility]   2) Location/Histology/Intervention: 02/06/16 Follow up Skin surgery center  3) Location/Histology/Intervention:  01/15/16: skin, bx: MOHS, layer right superior mid brow: Invasive squamous cell carcinoma with perineural invasion      History of Blistering sunburns, if any:, [place], occasionally, not bad sunburns  ,   SAFETY ISSUES: yes  Prior radiation? NO  Pacemaker/ICD? Yes, [employer] implanted  106/14/202003   Is the patient on methotrexate? No  Current Complaints / other details:  Widowed, 3 children, no family hx cancer BP 126/80 (BP Location: Left Arm, Patient Position: Sitting, Cuff Size: Normal)   Pulse 91   Temp 97.8 F (36.6 C)   Resp 18   Ht 4' (1.219 m)   Wt 111 lb 9.6 oz (50.6 kg)   SpO2 95% Comment: room air  BMI 34.06 kg/m   Wt Readings from Last 3 Encounters:  06/18/16 111 lb 9.6 oz (50.6 kg)  06/13/16 112 lb 8 oz (51 kg)  01/07/16 111 lb 12.8 oz (50.7 kg)

## 2016-06-18 ENCOUNTER — Ambulatory Visit
Admission: RE | Admit: 2016-06-18 | Discharge: 2016-06-18 | Disposition: A | Discharge status: Home / Self Care | Payer: Medicare Other | Source: Ambulatory Visit | Attending: Radiation Oncology | Admitting: Radiation Oncology

## 2016-06-18 ENCOUNTER — Encounter: Payer: Self-pay | Admitting: Radiation Oncology

## 2016-06-18 VITALS — BP 126/80 | HR 91 | Temp 97.8°F | Resp 18 | Ht <= 58 in | Wt 111.6 lb

## 2016-06-18 DIAGNOSIS — Z8249 Family history of ischemic heart disease and other diseases of the circulatory system: Secondary | ICD-10-CM | POA: Insufficient documentation

## 2016-06-18 DIAGNOSIS — E785 Hyperlipidemia, unspecified: Secondary | ICD-10-CM | POA: Diagnosis not present

## 2016-06-18 DIAGNOSIS — Z8261 Family history of arthritis: Secondary | ICD-10-CM | POA: Insufficient documentation

## 2016-06-18 DIAGNOSIS — C44329 Squamous cell carcinoma of skin of other parts of face: Secondary | ICD-10-CM | POA: Diagnosis not present

## 2016-06-18 DIAGNOSIS — Z79899 Other long term (current) drug therapy: Secondary | ICD-10-CM | POA: Insufficient documentation

## 2016-06-18 DIAGNOSIS — E079 Disorder of thyroid, unspecified: Secondary | ICD-10-CM | POA: Diagnosis not present

## 2016-06-18 DIAGNOSIS — H409 Unspecified glaucoma: Secondary | ICD-10-CM | POA: Diagnosis not present

## 2016-06-18 DIAGNOSIS — C44320 Squamous cell carcinoma of skin of unspecified parts of face: Secondary | ICD-10-CM

## 2016-06-18 DIAGNOSIS — M1990 Unspecified osteoarthritis, unspecified site: Secondary | ICD-10-CM | POA: Diagnosis not present

## 2016-06-18 NOTE — Progress Notes (Signed)
Please see the Nurse Progress Note in the MD Initial Consult Encounter for this patient. 

## 2016-06-18 NOTE — Progress Notes (Signed)
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   HISTORY OF PRESENT ILLNESS: Gloria Gonzalez is a 83 y.o. female seen at the request of Dr. Danielle Dess. The patient has a history of invasive squamous cell carcinoma of the right superior brow with perineural invasion in October 2017. Per the patient's report, she had a similar cancer in the same spot and was treated in [place], [place]. She underwent MOHS surgery on 01/15/16 for biopsy proven squamous cell carcinoma. She followed up on 02/19/16 for suture removal and repair of advancement flap on the mid superior brow at the skin surgery center. She continued to follow up with Dr. Danielle Dess until 06/11/16. The patient notes she had some precancerous spots on her scalp that were present prior to the diagnosis of squamous cell carcinoma of the eyebrow. These spots were treated with cryotherapy in January 2018. She presents today to determine the role of radiation therapy as part of her treatment management.   PREVIOUS RADIATION THERAPY: No   PAST MEDICAL HISTORY:  Past Medical History:  Diagnosis Date  . Arrhythmia   . Arthritis   . Glaucoma   . Hyperlipidemia   . Thyroid disease        PAST SURGICAL HISTORY: Past Surgical History:  Procedure Laterality Date  . EYE SURGERY    . PACEMAKER INSERTION  2003     FAMILY HISTORY:  Family History  Problem Relation Age of Onset  . Arthritis Mother   . Heart disease Sister   . Heart disease Brother      SOCIAL HISTORY:  reports that she has never smoked. She has never used smokeless tobacco. She reports that she does not drink alcohol or use drugs. The patient is accompanied by her two daughters and is  originally from [place]. She lives in [place].   ALLERGIES: Patient has no known allergies.   MEDICATIONS:  Current Outpatient Prescriptions  Medication Sig Dispense Refill  . atorvastatin (LIPITOR) 20 MG tablet Take 1 tablet (20 mg total) by mouth daily at 6 PM. 180 tablet 3  . Calcium Citrate-Vitamin D (CALCIUM + D PO) Take 1 tablet by mouth daily.    . cephALEXin (KEFLEX) 500 MG capsule Take 1 capsule (500 mg total) by mouth 3 (three) times daily. 21 capsule 0  . omega-3 acid ethyl esters (LOVAZA) 1 g capsule Take 1 capsule (1 g total) by mouth 2 (two) times daily. 180 capsule 3  . raloxifene (EVISTA) 60 MG tablet Take 1 tablet (60 mg total) by mouth daily. 90 tablet 3  . SYNTHROID 50 MCG tablet Take 1 tablet (50 mcg total) by mouth daily before breakfast. 90 tablet 3  . Emollient (ROC RETINOL CORREXION) CREA Apply topically (Patient not taking: Reported on 06/18/2016) 30 mL 11   No current facility-administered medications for this encounter.      REVIEW OF SYSTEMS: On review of systems, the patient reports that she is doing well overall. She denies any concerns with pain at the site of her previous surgery. She has had alopecia over the upper forehead and scalp. She denies any chest pain, shortness of breath, cough, fevers, chills, night sweats, unintended weight changes. She denies any bowel or bladder disturbances, and  denies abdominal pain, nausea or vomiting. She denies any new musculoskeletal or joint aches or pains. A complete review of systems is obtained and is otherwise negative.   PHYSICAL EXAM:  Wt Readings from Last 3 Encounters:  06/18/16 111 lb 9.6 oz (50.6 kg)  06/13/16 112 lb 8 oz (51 kg)  01/07/16 111 lb 12.8 oz (50.7 kg)   Temp Readings from Last 3 Encounters:  06/18/16 97.8 F (36.6 C)  06/13/16 97.6 F (36.4 C) (Oral)  12/26/15 98.3 F (36.8 C) (Oral)   BP Readings from Last 3 Encounters:  06/18/16 126/80  06/13/16 (!) 100/58  01/07/16 (!) 100/58     Pulse Readings from Last 3 Encounters:  06/18/16 91  06/13/16 84  01/07/16 85   Pain Assessment Pain Score: 0-No pain/10  In general this is a well appearing caucasian woman in no acute distress. She's alert and oriented x4 and appropriate throughout the examination. Cardiopulmonary assessment is negative for acute distress and she exhibits normal effort. There are postop changes above the right eyebrow along the scalp. Three small 2-4 mm lesions that have crusting have previously treated with cryotherapy.   ECOG = 0  0 - Asymptomatic (Fully active, able to carry on all predisease activities without restriction)  1 - Symptomatic but completely ambulatory (Restricted in physically strenuous activity but ambulatory and able to carry out work of a light or sedentary nature. For example, light housework, office work)  2 - Symptomatic, <50% in bed during the day (Ambulatory and capable of all self care but unable to carry out any work activities. Up and about more than 50% of waking hours)  3 - Symptomatic, >50% in bed, but not bedbound (Capable of only limited self-care, confined to bed or chair 50% or more of waking hours)  4 - Bedbound (Completely disabled. Cannot carry on any self-care. Totally confined to bed or chair)  5 - Death   Eustace Pen MM, Creech RH, Tormey DC, et al. [phone]). "Toxicity and response criteria of the [facility]". [place] Oncol. 5 (6): 649-55    LABORATORY DATA:  Lab Results  Component Value Date   WBC 9.0 06/21/2015   HGB 13.7 06/21/2015   HCT 38.6 06/21/2015   MCV 94.8 06/21/2015   PLT 283.0 06/21/2015   Lab Results  Component Value Date   NA 138 06/21/2015   K 4.3 06/21/2015   CL 103 06/21/2015   CO2 27 06/21/2015   Lab Results  Component Value Date   ALT 11 06/21/2015   AST 14 06/21/2015   ALKPHOS 44 06/21/2015   BILITOT 0.4 06/21/2015      RADIOGRAPHY: No results found.     IMPRESSION/PLAN: 1. Squamous cell  carcinoma of the right mid brow with perineural invasion. Dr. Lisbeth Renshaw reviews the pathology and reviews her history of skin cancer. Although she has concerns for perineural invasion, we discussed the limitations of not having information of the depth of invasion and margin status. However, Dr. Lisbeth Renshaw discusses that he feels as though the risk of recurrence is difficult to specifically detail in this circumstance, radiotherapy could be considered but may have low yield  We discussed the course of treatment that could be considered and side effects of radiation with the patient and her two daughters, and the patient is not interested in proceeding with radiation therapy at this time. She is willing to take this risk after discussing her options, and given the patient's comorbidities, age, and pathology report Dr.  Lisbeth Renshaw agrees that surveillance is a good option at this time. She was advised to follow up in radiation oncology on a prn basis.  In a visit lasting 45 minutes, greater than 50% of the time was spent face to face discussing the patient's surgical results, and coordinating the patient's care.   The above documentation reflects my direct findings during this shared patient visit. Please see the separate note by Dr. Lisbeth Renshaw on this date for the remainder of the patient's plan of care.    Carola Rhine, PAC  This document serves as a record of services personally performed by Kyung Rudd, MD and Shona Simpson, PA-C. It was created on their behalf by Bethann Humble, a trained medical scribe. The creation of this record is based on the scribe's personal observations and the provider's statements to them. This document has been checked and approved by the attending provider.

## 2016-06-23 ENCOUNTER — Other Ambulatory Visit (INDEPENDENT_AMBULATORY_CARE_PROVIDER_SITE_OTHER): Payer: Medicare Other

## 2016-06-23 ENCOUNTER — Ambulatory Visit (INDEPENDENT_AMBULATORY_CARE_PROVIDER_SITE_OTHER): Payer: Medicare Other | Admitting: Internal Medicine

## 2016-06-23 ENCOUNTER — Encounter: Payer: Self-pay | Admitting: Internal Medicine

## 2016-06-23 ENCOUNTER — Other Ambulatory Visit (HOSPITAL_COMMUNITY): Payer: Medicare Other

## 2016-06-23 VITALS — BP 110/60 | HR 86 | Temp 98.0°F | Resp 12 | Ht <= 58 in | Wt 111.0 lb

## 2016-06-23 DIAGNOSIS — E785 Hyperlipidemia, unspecified: Secondary | ICD-10-CM

## 2016-06-23 DIAGNOSIS — Z950 Presence of cardiac pacemaker: Secondary | ICD-10-CM

## 2016-06-23 DIAGNOSIS — M810 Age-related osteoporosis without current pathological fracture: Secondary | ICD-10-CM | POA: Diagnosis not present

## 2016-06-23 LAB — LIPID PANEL
CHOLESTEROL: 126 mg/dL (ref 0–200)
HDL: 32.1 mg/dL — ABNORMAL LOW (ref >39.00)
NonHDL: 93.97
TRIGLYCERIDES: 312 mg/dL — AB (ref 0.0–149.0)
Total CHOL/HDL Ratio: 4
VLDL: 62.4 mg/dL — AB (ref 0.0–40.0)

## 2016-06-23 LAB — LDL CHOLESTEROL, DIRECT: LDL DIRECT: 61 mg/dL

## 2016-06-23 LAB — COMPREHENSIVE METABOLIC PANEL
ALBUMIN: 4 g/dL (ref 3.5–5.2)
ALT: 11 U/L (ref 0–35)
AST: 16 U/L (ref 0–37)
Alkaline Phosphatase: 44 U/L (ref 39–117)
BILIRUBIN TOTAL: 0.3 mg/dL (ref 0.2–1.2)
BUN: 32 mg/dL — AB (ref 6–23)
CHLORIDE: 101 meq/L (ref 96–112)
CO2: 32 mEq/L (ref 19–32)
CREATININE: 1.2 mg/dL (ref 0.40–1.20)
Calcium: 9.7 mg/dL (ref 8.4–10.5)
GFR: 44.56 mL/min — ABNORMAL LOW (ref >60.00)
Glucose, Bld: 85 mg/dL (ref 70–99)
Potassium: 4.4 mEq/L (ref 3.5–5.1)
SODIUM: 139 meq/L (ref 135–145)
TOTAL PROTEIN: 7.3 g/dL (ref 6.0–8.3)

## 2016-06-23 LAB — CBC
HEMATOCRIT: 39.2 % (ref 36.0–46.0)
Hemoglobin: 13.1 g/dL (ref 12.0–15.0)
MCHC: 33.5 g/dL (ref 30.0–36.0)
MCV: 97 fl (ref 78.0–100.0)
Platelets: 297 10*3/uL (ref 150.0–400.0)
RBC: 4.04 Mil/uL (ref 3.87–5.11)
RDW: 14 % (ref 11.5–15.5)
WBC: 9.5 10*3/uL (ref 4.0–10.5)

## 2016-06-23 MED ORDER — ATORVASTATIN CALCIUM 20 MG PO TABS: 20.0000 mg | ORAL_TABLET | Freq: Every day | ORAL | 3 refills | Status: DC

## 2016-06-23 MED ORDER — SYNTHROID 50 MCG PO TABS: 50.0000 ug | ORAL_TABLET | Freq: Every day | ORAL | 3 refills | Status: DC

## 2016-06-23 MED ORDER — RALOXIFENE HCL 60 MG PO TABS: 60.0000 mg | ORAL_TABLET | Freq: Every day | ORAL | 3 refills | Status: DC

## 2016-06-23 NOTE — Patient Instructions (Signed)
We will check the labs today and call you back with the results.    

## 2016-06-23 NOTE — Progress Notes (Signed)
Pre visit review using our clinic review tool, if applicable. No additional management support is needed unless otherwise documented below in the visit note. 

## 2016-06-23 NOTE — Assessment & Plan Note (Signed)
Checking lipid panel and adjust as needed, taking lipitor 20 mg daily.

## 2016-06-23 NOTE — Assessment & Plan Note (Signed)
Refill evista and she declines repeat bone density at this time. No fracture currently.

## 2016-06-23 NOTE — Progress Notes (Signed)
   Subjective:    Patient ID: Gloria Gonzalez, female    DOB: 03/21/1922, 83 y.o.   MRN: [id number]  HPI The patient is a 83 YO female coming in for follow up of her cholesterol. She is still taking her lipitor 20 mg daily. She denies side effects from this medicine. No leg cramps or muscle pain. She does have chronic arthritis and this is still present. Doing some exercise classes which has helped her mobility some at home. No new complaints.   Review of Systems  Constitutional: Positive for activity change. Negative for appetite change, fatigue, fever and unexpected weight change.  Respiratory: Negative.   Cardiovascular: Negative.   Gastrointestinal: Negative.   Musculoskeletal: Positive for arthralgias. Negative for back pain, gait problem, myalgias, neck pain and neck stiffness.  Skin: Negative.       Objective:   Physical Exam  Constitutional: She appears well-developed and well-nourished.  HENT:  Head: Normocephalic and atraumatic.  Eyes: EOM are normal.  Neck: Normal range of motion.  Cardiovascular: Normal rate and regular rhythm.   Murmur heard. Pulmonary/Chest: Effort normal and breath sounds normal.  Abdominal: Soft.  Skin: Skin is warm and dry.   Vitals:   06/23/16 1256  BP: 110/60  Pulse: 86  Resp: 12  Temp: 98 F (36.7 C)  TempSrc: Oral  SpO2: 94%  Weight: 111 lb (50.3 kg)  Height: 4' (1.219 m)      Assessment & Plan:

## 2016-06-25 ENCOUNTER — Other Ambulatory Visit (HOSPITAL_COMMUNITY): Payer: Medicare Other

## 2016-07-02 ENCOUNTER — Ambulatory Visit (HOSPITAL_COMMUNITY): Payer: Medicare Other | Attending: Cardiovascular Disease

## 2016-07-02 ENCOUNTER — Other Ambulatory Visit: Payer: Self-pay

## 2016-07-02 DIAGNOSIS — Z8249 Family history of ischemic heart disease and other diseases of the circulatory system: Secondary | ICD-10-CM | POA: Insufficient documentation

## 2016-07-02 DIAGNOSIS — Z8679 Personal history of other diseases of the circulatory system: Secondary | ICD-10-CM | POA: Diagnosis not present

## 2016-07-02 DIAGNOSIS — E785 Hyperlipidemia, unspecified: Secondary | ICD-10-CM | POA: Diagnosis not present

## 2016-07-02 DIAGNOSIS — I050 Rheumatic mitral stenosis: Secondary | ICD-10-CM | POA: Diagnosis not present

## 2016-07-02 DIAGNOSIS — I083 Combined rheumatic disorders of mitral, aortic and tricuspid valves: Secondary | ICD-10-CM | POA: Diagnosis not present

## 2016-07-02 DIAGNOSIS — I351 Nonrheumatic aortic (valve) insufficiency: Secondary | ICD-10-CM

## 2016-07-06 ENCOUNTER — Telehealth: Payer: Self-pay | Admitting: Cardiology

## 2016-07-06 NOTE — Telephone Encounter (Signed)
New message   Pt is returning call about her echo results.

## 2016-07-06 NOTE — Telephone Encounter (Signed)
Informed pt of echo results. Pt verbalized understanding. 

## 2016-07-07 DIAGNOSIS — H401133 Primary open-angle glaucoma, bilateral, severe stage: Secondary | ICD-10-CM | POA: Diagnosis not present

## 2016-07-07 DIAGNOSIS — H3561 Retinal hemorrhage, right eye: Secondary | ICD-10-CM | POA: Diagnosis not present

## 2016-07-07 DIAGNOSIS — D3142 Benign neoplasm of left ciliary body: Secondary | ICD-10-CM | POA: Diagnosis not present

## 2016-07-07 DIAGNOSIS — H04123 Dry eye syndrome of bilateral lacrimal glands: Secondary | ICD-10-CM | POA: Diagnosis not present

## 2016-07-13 ENCOUNTER — Telehealth: Payer: Self-pay | Admitting: Internal Medicine

## 2016-07-13 DIAGNOSIS — Z950 Presence of cardiac pacemaker: Secondary | ICD-10-CM

## 2016-07-13 MED ORDER — OMEGA-3-ACID ETHYL ESTERS 1 G PO CAPS: 1.0000 g | ORAL_CAPSULE | Freq: Two times a day (BID) | ORAL | 3 refills | Status: DC

## 2016-07-13 NOTE — Telephone Encounter (Signed)
Patient is requesting refill on Omega 3.  Patient uses [facility].

## 2016-07-13 NOTE — Telephone Encounter (Signed)
Sent in

## 2016-07-14 ENCOUNTER — Encounter: Payer: Self-pay | Admitting: Internal Medicine

## 2016-07-14 ENCOUNTER — Ambulatory Visit (INDEPENDENT_AMBULATORY_CARE_PROVIDER_SITE_OTHER): Payer: Medicare Other | Admitting: Internal Medicine

## 2016-07-14 ENCOUNTER — Ambulatory Visit (INDEPENDENT_AMBULATORY_CARE_PROVIDER_SITE_OTHER)
Admission: RE | Admit: 2016-07-14 | Discharge: 2016-07-14 | Disposition: A | Discharge status: Home / Self Care | Payer: Medicare Other | Source: Ambulatory Visit | Attending: Internal Medicine | Admitting: Internal Medicine

## 2016-07-14 VITALS — BP 130/70 | HR 83 | Temp 97.6°F | Resp 14 | Ht <= 58 in | Wt 111.0 lb

## 2016-07-14 DIAGNOSIS — M5441 Lumbago with sciatica, right side: Secondary | ICD-10-CM | POA: Diagnosis not present

## 2016-07-14 DIAGNOSIS — M25551 Pain in right hip: Secondary | ICD-10-CM | POA: Diagnosis not present

## 2016-07-14 DIAGNOSIS — M25552 Pain in left hip: Secondary | ICD-10-CM | POA: Diagnosis not present

## 2016-07-14 NOTE — Assessment & Plan Note (Signed)
Checking x-ray pelvis both hips to check for fracture or significant changes. She will continue taking ibuprofen and can add tylenol if needed. Can use heat/ice as well.

## 2016-07-14 NOTE — Progress Notes (Signed)
   Subjective:    Patient ID: Gloria Gonzalez, female    DOB: 1922/01/28, 83 y.o.   MRN: [id number]  HPI The patient is a 83 YO female coming in for lower back and hip pain. More on the right side than the left. Radiates down the right leg. Denies any numbness or weakness. Started about 2-3 days after car trip from [place]. No other injury or overuse. No falls. She denies change to bowel or bladder habits. She has been taking ibuprofen at home which helps a lot. She states pain is 7/10 at worst and 3/10 after ibuprofen. Has tried heat some which is not very effective. She is not able to take pain medication due to over sedation even with lidoderm patches otc. Due to prior history of many fractures wanted to make sure no broken bones. Worse with sitting for a long time.   Review of Systems  Constitutional: Positive for activity change. Negative for appetite change, chills, fatigue and fever.  Respiratory: Negative.   Cardiovascular: Negative.   Gastrointestinal: Negative.   Musculoskeletal: Positive for arthralgias, back pain and myalgias. Negative for gait problem, joint swelling, neck pain and neck stiffness.  Skin: Negative.   Neurological: Negative.       Objective:   Physical Exam  Constitutional: She is oriented to person, place, and time. She appears well-developed and well-nourished.  HENT:  Head: Normocephalic and atraumatic.  Eyes: EOM are normal.  Cardiovascular: Normal rate and regular rhythm.   Pulmonary/Chest: Effort normal and breath sounds normal.  Abdominal: Soft.  Musculoskeletal: She exhibits tenderness.  Pain in the SI region bilaterally, no groin pain or spinal tenderness.   Neurological: She is alert and oriented to person, place, and time. Coordination abnormal.  Slow to stand and hesitating gait, using wheelchair today for longer distances  Skin: Skin is warm and dry.   Vitals:   07/14/16 1119  BP: 130/70  Pulse: 83  Resp: 14  Temp: 97.6 F (36.4 C)    TempSrc: Oral  SpO2: 94%  Weight: 111 lb (50.3 kg)  Height: 4' (1.219 m)      Assessment & Plan:

## 2016-07-14 NOTE — Patient Instructions (Signed)
We will check the x-ray today and call you back with the results.  It is okay to take the ibuprofen and the acetaminophen together for the pain and use heat or ice pack.

## 2016-07-14 NOTE — Progress Notes (Signed)
Pre visit review using our clinic review tool, if applicable. No additional management support is needed unless otherwise documented below in the visit note. 

## 2016-07-16 ENCOUNTER — Telehealth: Payer: Self-pay | Admitting: Internal Medicine

## 2016-07-16 NOTE — Telephone Encounter (Signed)
Pt would like to know how long it will be for her muscle strain to fell better.

## 2016-07-16 NOTE — Telephone Encounter (Signed)
Patient contacted

## 2016-07-23 ENCOUNTER — Ambulatory Visit (INDEPENDENT_AMBULATORY_CARE_PROVIDER_SITE_OTHER): Payer: Self-pay | Admitting: *Deleted

## 2016-07-23 DIAGNOSIS — I442 Atrioventricular block, complete: Secondary | ICD-10-CM

## 2016-07-23 NOTE — Progress Notes (Signed)
Battery Check:  Estimated Longevity :2 months, <1-9 months. ROV 09/03/2016 for device check.

## 2016-09-03 ENCOUNTER — Ambulatory Visit (INDEPENDENT_AMBULATORY_CARE_PROVIDER_SITE_OTHER): Payer: Medicare Other | Admitting: *Deleted

## 2016-09-03 DIAGNOSIS — I442 Atrioventricular block, complete: Secondary | ICD-10-CM | POA: Diagnosis not present

## 2016-09-03 LAB — CUP PACEART INCLINIC DEVICE CHECK
Brady Statistic AP VP Percent: 0.1 %
Brady Statistic AP VS Percent: 0 %
Brady Statistic AS VP Percent: 99.8 %
Implantable Lead Implant Date: 20031112
Implantable Lead Implant Date: 20031112
Implantable Lead Location: 753859
Implantable Lead Model: 4092
Implantable Lead Model: 4592
Implantable Pulse Generator Implant Date: 20031112
Lead Channel Impedance Value: 1041
Lead Channel Pacing Threshold Amplitude: 0.5 V
Lead Channel Pacing Threshold Pulse Width: 0.2 ms
Lead Channel Sensing Intrinsic Amplitude: 2 mV
MDC IDC LEAD LOCATION: 753860
MDC IDC MSMT LEADCHNL RA IMPEDANCE VALUE: 652 Ohm
MDC IDC MSMT LEADCHNL RA PACING THRESHOLD AMPLITUDE: 0.5 V
MDC IDC MSMT LEADCHNL RA PACING THRESHOLD PULSEWIDTH: 0.25 ms
MDC IDC SESS DTM: 20180607145043
MDC IDC STAT BRADY AS VS PERCENT: 0.1 % — AB

## 2016-09-03 NOTE — Progress Notes (Signed)
Pacemaker check in clinic. Normal device function. Thresholds, sensing, impedances consistent with previous measurements. Device programmed to maximize longevity. 5 mode switches, No EGMs. 5 AHR, peak 219 bpm, longest 1 minute. No high ventricular rates noted. Device programmed at appropriate safety margins. Histogram distribution appropriate for patient activity level. Device programmed to optimize intrinsic conduction. Estimated longevity <1 month, <1-6 months. Patient education completed. ROV with device clinic 10/05/2016 for battery check.

## 2016-09-08 DIAGNOSIS — H04123 Dry eye syndrome of bilateral lacrimal glands: Secondary | ICD-10-CM | POA: Diagnosis not present

## 2016-09-08 DIAGNOSIS — H01004 Unspecified blepharitis left upper eyelid: Secondary | ICD-10-CM | POA: Diagnosis not present

## 2016-09-08 DIAGNOSIS — H01001 Unspecified blepharitis right upper eyelid: Secondary | ICD-10-CM | POA: Diagnosis not present

## 2016-10-07 ENCOUNTER — Ambulatory Visit (INDEPENDENT_AMBULATORY_CARE_PROVIDER_SITE_OTHER): Payer: Medicare Other | Admitting: *Deleted

## 2016-10-07 DIAGNOSIS — I442 Atrioventricular block, complete: Secondary | ICD-10-CM

## 2016-10-07 LAB — CUP PACEART INCLINIC DEVICE CHECK
Implantable Lead Location: 753859
Implantable Lead Model: 4092
Implantable Lead Model: 4592
Implantable Pulse Generator Implant Date: 20031112
MDC IDC LEAD IMPLANT DT: 20031112
MDC IDC LEAD IMPLANT DT: 20031112
MDC IDC LEAD LOCATION: 753860
MDC IDC SESS DTM: 20180711165005

## 2016-10-07 NOTE — Progress Notes (Signed)
Pacemaker battery check in clinic (N/C). Estimated longevity <1 month (<1-4 months). ROV with Device Clinic 11/04/16 for battery check.

## 2016-11-02 DIAGNOSIS — Z85828 Personal history of other malignant neoplasm of skin: Secondary | ICD-10-CM | POA: Diagnosis not present

## 2016-11-02 DIAGNOSIS — L814 Other melanin hyperpigmentation: Secondary | ICD-10-CM | POA: Diagnosis not present

## 2016-11-02 DIAGNOSIS — L821 Other seborrheic keratosis: Secondary | ICD-10-CM | POA: Diagnosis not present

## 2016-11-02 DIAGNOSIS — D1801 Hemangioma of skin and subcutaneous tissue: Secondary | ICD-10-CM | POA: Diagnosis not present

## 2016-11-02 DIAGNOSIS — D485 Neoplasm of uncertain behavior of skin: Secondary | ICD-10-CM | POA: Diagnosis not present

## 2016-11-02 DIAGNOSIS — L570 Actinic keratosis: Secondary | ICD-10-CM | POA: Diagnosis not present

## 2016-11-02 DIAGNOSIS — L308 Other specified dermatitis: Secondary | ICD-10-CM | POA: Diagnosis not present

## 2016-11-03 DIAGNOSIS — H401133 Primary open-angle glaucoma, bilateral, severe stage: Secondary | ICD-10-CM | POA: Diagnosis not present

## 2016-11-03 DIAGNOSIS — D3142 Benign neoplasm of left ciliary body: Secondary | ICD-10-CM | POA: Diagnosis not present

## 2016-11-03 DIAGNOSIS — H3561 Retinal hemorrhage, right eye: Secondary | ICD-10-CM | POA: Diagnosis not present

## 2016-11-03 DIAGNOSIS — H04123 Dry eye syndrome of bilateral lacrimal glands: Secondary | ICD-10-CM | POA: Diagnosis not present

## 2016-11-03 DIAGNOSIS — H01004 Unspecified blepharitis left upper eyelid: Secondary | ICD-10-CM | POA: Diagnosis not present

## 2016-11-03 DIAGNOSIS — H353131 Nonexudative age-related macular degeneration, bilateral, early dry stage: Secondary | ICD-10-CM | POA: Diagnosis not present

## 2016-11-03 DIAGNOSIS — H01001 Unspecified blepharitis right upper eyelid: Secondary | ICD-10-CM | POA: Diagnosis not present

## 2016-11-04 ENCOUNTER — Ambulatory Visit (INDEPENDENT_AMBULATORY_CARE_PROVIDER_SITE_OTHER): Payer: Medicare Other | Admitting: *Deleted

## 2016-11-04 DIAGNOSIS — I442 Atrioventricular block, complete: Secondary | ICD-10-CM | POA: Diagnosis not present

## 2016-11-04 DIAGNOSIS — Z950 Presence of cardiac pacemaker: Secondary | ICD-10-CM

## 2016-11-04 LAB — CUP PACEART INCLINIC DEVICE CHECK
Battery Impedance: 10576 Ohm
Battery Voltage: 2.61 V
Brady Statistic RV Percent Paced: 100 %
Date Time Interrogation Session: 20180808174423
Implantable Lead Implant Date: 20031112
Implantable Lead Implant Date: 20031112
Implantable Lead Location: 753859
Implantable Lead Location: 753860
Implantable Lead Model: 4092
Lead Channel Impedance Value: 0 Ohm
Lead Channel Setting Pacing Amplitude: 1.5 V
Lead Channel Setting Pacing Pulse Width: 0.2 ms
Lead Channel Setting Sensing Sensitivity: 2.8 mV
MDC IDC MSMT LEADCHNL RV IMPEDANCE VALUE: 1007 Ohm
MDC IDC MSMT LEADCHNL RV PACING THRESHOLD AMPLITUDE: 0.5 V
MDC IDC MSMT LEADCHNL RV PACING THRESHOLD PULSEWIDTH: 0.2 ms
MDC IDC MSMT LEADCHNL RV SENSING INTR AMPL: 11.2 mV
MDC IDC PG IMPLANT DT: 20031112

## 2016-11-04 NOTE — Progress Notes (Signed)
Pacemaker check in clinic. Normal device function, ERI as of 11/02/16. RV thresholds, sensing, and impedances consistent with previous measurements. Device programmed to maximize longevity. No high ventricular rates noted. Device programmed at appropriate safety margins. Histogram distribution appropriate for patient activity level. Patient education completed. Will plan to call patient with next available [facility] appointment to discuss generator change.

## 2016-11-06 ENCOUNTER — Telehealth: Payer: Self-pay | Admitting: *Deleted

## 2016-11-06 NOTE — Telephone Encounter (Signed)
Spoke with patient's daughter to schedule appointment with provider to discuss PPM generator change procedure.  Patient's daughter will plan to call the [facility] later today to schedule as she is not at home right now.

## 2016-11-06 NOTE — Telephone Encounter (Signed)
Spoke with Juliann Pulse (daughter) at [facility] request.  She agrees to an appointment on 11/19/16 at 2:00pm (first available opening) with Tommye Standard, PA.  Juliann Pulse is aware that they will discuss patient's generator change procedure at this appointment.  She is appreciative and denies additional questions or concerns at this time.

## 2016-11-06 NOTE — Telephone Encounter (Signed)
New Message     Pt returned your call, please call her at home , she will be there the rest of the day.

## 2016-11-12 ENCOUNTER — Telehealth: Payer: Self-pay | Admitting: *Deleted

## 2016-11-12 ENCOUNTER — Encounter: Payer: Self-pay | Admitting: *Deleted

## 2016-11-12 NOTE — Telephone Encounter (Signed)
Scheduled PPM generator change for 8/30. Instructions reviewed w/ dtr Joycelyn Schmid - aka: Lelon Frohlich) (DPR on file).   She is aware pt needs to arrive at 9:30 a.m morning of procedure. Pre procedure labs and post procedure f/u will be completed/arranged next week when she comes in to see Tommye Standard, PA. Dtr also understands they will be given procedure letter of instructions at this visit.  Dtr verbalized understanding and agreeable to plan.

## 2016-11-17 DIAGNOSIS — D3142 Benign neoplasm of left ciliary body: Secondary | ICD-10-CM | POA: Diagnosis not present

## 2016-11-17 DIAGNOSIS — H01001 Unspecified blepharitis right upper eyelid: Secondary | ICD-10-CM | POA: Diagnosis not present

## 2016-11-17 DIAGNOSIS — H01004 Unspecified blepharitis left upper eyelid: Secondary | ICD-10-CM | POA: Diagnosis not present

## 2016-11-17 DIAGNOSIS — H353131 Nonexudative age-related macular degeneration, bilateral, early dry stage: Secondary | ICD-10-CM | POA: Diagnosis not present

## 2016-11-18 ENCOUNTER — Encounter: Payer: Self-pay | Admitting: Physician Assistant

## 2016-11-19 ENCOUNTER — Encounter: Payer: Self-pay | Admitting: Physician Assistant

## 2016-11-19 ENCOUNTER — Ambulatory Visit (INDEPENDENT_AMBULATORY_CARE_PROVIDER_SITE_OTHER): Payer: Medicare Other | Admitting: Physician Assistant

## 2016-11-19 VITALS — BP 108/56 | HR 65 | Ht <= 58 in | Wt 112.0 lb

## 2016-11-19 DIAGNOSIS — Z45010 Encounter for checking and testing of cardiac pacemaker pulse generator [battery]: Secondary | ICD-10-CM

## 2016-11-19 DIAGNOSIS — Z01818 Encounter for other preprocedural examination: Secondary | ICD-10-CM | POA: Diagnosis not present

## 2016-11-19 NOTE — Progress Notes (Signed)
Cardiology Office Note Date:  11/19/2016  Patient ID:  Joanny, Dupree 07/30/1921, MRN [id number] PCP:  Hoyt Koch, MD  Electrophysiologist: Dr. Curt Bears    Chief Complaint: device visit, ERI  History of Present Illness: Daylen Hack is a 83 y.o. female with history of PPM (presumably for heart block), HLD, hypothyroidism, comes in today to be seen for Dr. Curt Bears.  Last seen by him in Oct., doing well at that time.  The patient is accompanied by her daughter, she has been feeling well, but a little tired.  No CP, palpitations or SOB, no near syncope or syncope.  She had an eye infection that is better, no other illnesses, no fever.  Device information: MDT dual chamber PPM, [place] 01[place]29/2002 [place] follows w/Dr. Curt Bears   Past Medical History:  Diagnosis Date  . Arrhythmia   . Arthritis   . Glaucoma   . Hyperlipidemia   . Thyroid disease     Past Surgical History:  Procedure Laterality Date  . EYE SURGERY    . PACEMAKER INSERTION  2003    Current Outpatient Prescriptions  Medication Sig Dispense Refill  . atorvastatin (LIPITOR) 20 MG tablet Take 1 tablet (20 mg total) by mouth daily at 6 PM. 180 tablet 3  . Calcium Citrate-Vitamin D (CALCIUM + D PO) Take 1 tablet by mouth daily.    . carboxymethylcellulose (REFRESH PLUS) 0.5 % SOLN 1 drop 4 (four) times daily. For dry eyes    . Emollient (ROC RETINOL CORREXION) CREA Apply topically 30 mL 11  . Eyelid Cleansers (OCUSOFT BABY EYELID & EYELASH) PADS Apply topically. Use as directed.    Marland Kitchen Lifitegrast (XIIDRA OP) Apply 1 drop to eye daily.    Marland Kitchen omega-3 acid ethyl esters (LOVAZA) 1 g capsule Take 1 capsule (1 g total) by mouth 2 (two) times daily. 180 capsule 3  . raloxifene (EVISTA) 60 MG tablet Take 1 tablet (60 mg total) by mouth daily. 90 tablet 3  . SYNTHROID 50 MCG tablet Take 1 tablet (50 mcg total) by mouth daily before breakfast. 90 tablet 3  . tobramycin-dexamethasone (TOBRADEX)  ophthalmic solution every 4 (four) hours while awake.     No current facility-administered medications for this visit.     Allergies:   Patient has no known allergies.   Social History:  The patient  reports that she has never smoked. She has never used smokeless tobacco. She reports that she does not drink alcohol or use drugs.   Family History:  [name] family history includes Arthritis in her mother; Heart disease in her brother and sister.  ROS:  Please see the history of present illness.  All other systems are reviewed and otherwise negative.   PHYSICAL EXAM:  VS:  BP (!) 108/56   Pulse 65   Ht 4' (1.219 m)   Wt 112 lb (50.8 kg)   BMI 34.18 kg/m  BMI: Body mass index is 34.18 kg/m. Well nourished, well developed, in no acute distress  HEENT: normocephalic, atraumatic  Neck: no JVD, carotid bruits or masses Cardiac:  RRR; no significant murmurs, no rubs, or gallops Lungs:  CTA b/l, no wheezing, rhonchi or rales  Abd: soft, nontender MS: no deformity, age appropriate atrophy Ext: no edema  Skin: warm and dry, no rash Neuro:  No gross deficits appreciated Psych: euthymic mood, full affect  PPM site is stable, no tethering or discomfort   EKG:  Done today and reviewed by myself shows V pacing  asynchronously  PPM interrogation done today and reviewed by myself: reached ERI 11/02/16, V lead programmed as noted on prior device checks.  No other data available.  12/29/12: TTE LVEF 65-70% Mild AI Mld-mod MR, mild MS Mild TR  Recent Labs: 06/23/2016: ALT 11; BUN 32; Creatinine, Ser 1.20; Hemoglobin 13.1; Platelets 297.0; Potassium 4.4; Sodium 139  06/23/2016: Cholesterol 126; Direct LDL 61.0; HDL 32.10; Total CHOL/HDL Ratio 4; Triglycerides 312.0; VLDL 62.4   CrCl cannot be calculated (Patient's most recent lab result is older than the maximum 21 days allowed.).   Wt Readings from Last 3 Encounters:  11/19/16 112 lb (50.8 kg)  07/14/16 111 lb (50.3 kg)  06/23/16 111  lb (50.3 kg)     Other studies reviewed: Additional studies/records reviewed today include: summarized above  ASSESSMENT AND PLAN:  1. PPM     Reached ERI     Generator change procedure, risks, benefits were discussed and she would like to proceed.  Routine post gen change appts will be arranged.   Disposition: F/u with wound check post procedure, sooner if needed.  Current medicines are reviewed at length with the patient today.  The patient did not have any concerns regarding medicines.  Haywood Lasso, PA-C 11/19/2016 2:54 PM     [place] [address] [place] [address] [phone] (office)  [phone] (fax)

## 2016-11-19 NOTE — Patient Instructions (Signed)
Medication Instructions:  Your physician recommends that you continue on your current medications as directed. Please refer to the Current Medication list given to you today.   Labwork: Your physician recommends that you return for lab work today for BMET, CBC  Testing/Procedures: None Ordered   Follow-Up: None Ordered   Any Other Special Instructions Will Be Listed Below (If Applicable).     If you need a refill on your cardiac medications before your next appointment, please call your pharmacy.

## 2016-11-20 LAB — BASIC METABOLIC PANEL
BUN/Creatinine Ratio: 27 (ref 12–28)
BUN: 36 mg/dL (ref 10–36)
CALCIUM: 10.2 mg/dL (ref 8.7–10.3)
CHLORIDE: 99 mmol/L (ref 96–106)
CO2: 26 mmol/L (ref 20–29)
Creatinine, Ser: 1.33 mg/dL — ABNORMAL HIGH (ref 0.57–1.00)
GFR, EST AFRICAN AMERICAN: 40 mL/min/{1.73_m2} — AB (ref >59)
GFR, EST NON AFRICAN AMERICAN: 34 mL/min/{1.73_m2} — AB (ref >59)
Glucose: 86 mg/dL (ref 65–99)
POTASSIUM: 4.8 mmol/L (ref 3.5–5.2)
SODIUM: 139 mmol/L (ref 134–144)

## 2016-11-20 LAB — CBC
Hematocrit: 40.9 % (ref 34.0–46.6)
Hemoglobin: 13.5 g/dL (ref 11.1–15.9)
MCH: 32.1 pg (ref 26.6–33.0)
MCHC: 33 g/dL (ref 31.5–35.7)
MCV: 97 fL (ref 79–97)
PLATELETS: 342 10*3/uL (ref 150–379)
RBC: 4.21 x10E6/uL (ref 3.77–5.28)
RDW: 14.4 % (ref 12.3–15.4)
WBC: 11.2 10*3/uL — AB (ref 3.4–10.8)

## 2016-11-23 NOTE — Addendum Note (Signed)
Addended by: Hosie Poisson R on: 11/23/2016 05:05 PM   Modules accepted: Orders

## 2016-11-26 ENCOUNTER — Ambulatory Visit (HOSPITAL_COMMUNITY)
Admission: RE | Admit: 2016-11-26 | Discharge: 2016-11-26 | Disposition: A | Discharge status: Home / Self Care | Payer: Medicare Other | Source: Ambulatory Visit | Attending: Cardiology | Admitting: Cardiology

## 2016-11-26 ENCOUNTER — Encounter (HOSPITAL_COMMUNITY)
Admission: RE | Disposition: A | Discharge status: Home / Self Care | Payer: Self-pay | Source: Ambulatory Visit | Attending: Cardiology

## 2016-11-26 DIAGNOSIS — M1990 Unspecified osteoarthritis, unspecified site: Secondary | ICD-10-CM | POA: Diagnosis not present

## 2016-11-26 DIAGNOSIS — Z79899 Other long term (current) drug therapy: Secondary | ICD-10-CM | POA: Diagnosis not present

## 2016-11-26 DIAGNOSIS — E039 Hypothyroidism, unspecified: Secondary | ICD-10-CM | POA: Diagnosis not present

## 2016-11-26 DIAGNOSIS — E785 Hyperlipidemia, unspecified: Secondary | ICD-10-CM | POA: Insufficient documentation

## 2016-11-26 DIAGNOSIS — Z9889 Other specified postprocedural states: Secondary | ICD-10-CM | POA: Diagnosis not present

## 2016-11-26 DIAGNOSIS — R001 Bradycardia, unspecified: Secondary | ICD-10-CM | POA: Diagnosis not present

## 2016-11-26 DIAGNOSIS — Z45010 Encounter for checking and testing of cardiac pacemaker pulse generator [battery]: Secondary | ICD-10-CM | POA: Insufficient documentation

## 2016-11-26 HISTORY — PX: PPM GENERATOR CHANGEOUT: EP1233

## 2016-11-26 LAB — SURGICAL PCR SCREEN
MRSA, PCR: NEGATIVE
STAPHYLOCOCCUS AUREUS: NEGATIVE

## 2016-11-26 SURGERY — PPM GENERATOR CHANGEOUT

## 2016-11-26 MED ORDER — CEFAZOLIN SODIUM-DEXTROSE 2-4 GM/100ML-% IV SOLN
INTRAVENOUS | Status: AC
  Filled 2016-11-26: qty 100

## 2016-11-26 MED ORDER — CEFAZOLIN SODIUM-DEXTROSE 2-4 GM/100ML-% IV SOLN
2.0000 g | INTRAVENOUS | Status: AC
  Administered 2016-11-26: 2 g via INTRAVENOUS

## 2016-11-26 MED ORDER — LIDOCAINE HCL (PF) 1 % IJ SOLN
INTRAMUSCULAR | Status: DC | PRN
  Administered 2016-11-26: 20 mL

## 2016-11-26 MED ORDER — SODIUM CHLORIDE 0.9 % IV SOLN
INTRAVENOUS | Status: DC
  Administered 2016-11-26: 11:00:00 via INTRAVENOUS

## 2016-11-26 MED ORDER — SODIUM CHLORIDE 0.9 % IR SOLN
Status: AC
  Filled 2016-11-26: qty 2

## 2016-11-26 MED ORDER — SODIUM CHLORIDE 0.9 % IR SOLN: 80.0000 mg | Status: DC

## 2016-11-26 MED ORDER — ONDANSETRON HCL 4 MG/2ML IJ SOLN: 4.0000 mg | Freq: Four times a day (QID) | INTRAMUSCULAR | Status: DC | PRN

## 2016-11-26 MED ORDER — MUPIROCIN 2 % EX OINT
TOPICAL_OINTMENT | CUTANEOUS | Status: AC
  Administered 2016-11-26: 11:00:00
  Filled 2016-11-26: qty 22

## 2016-11-26 MED ORDER — LIDOCAINE HCL (PF) 1 % IJ SOLN
INTRAMUSCULAR | Status: AC
  Filled 2016-11-26: qty 60

## 2016-11-26 MED ORDER — CHLORHEXIDINE GLUCONATE 4 % EX LIQD: 60.0000 mL | Freq: Once | CUTANEOUS | Status: DC

## 2016-11-26 MED ORDER — ACETAMINOPHEN 325 MG PO TABS
325.0000 mg | ORAL_TABLET | ORAL | Status: DC | PRN
  Filled 2016-11-26: qty 2

## 2016-11-26 SURGICAL SUPPLY — 6 items
CABLE SURGICAL S-101-97-12 (CABLE) ×3 IMPLANT
KIT ESSENTIALS PG (KITS) ×3 IMPLANT
PACEMAKER ADAPTA DR ADDRL1 (Pacemaker) ×1 IMPLANT
PAD DEFIB LIFELINK (PAD) ×3 IMPLANT
PPM ADAPTA DR ADDRL1 (Pacemaker) ×3 IMPLANT
TRAY PACEMAKER INSERTION (PACKS) ×3 IMPLANT

## 2016-11-26 NOTE — H&P (Signed)
Gloria Gonzalez is a 83 y.o. female with a history of heart block with a Medtronic dual chamber pacemaker in place. The battery has reached ERI. Plan for genrerator change. On exam, RRR, no murmurs, lungs clear. Risks and benefits explained. Risks include but not limited to bleeding, infection. The patient understands the risks and has agreed to the procedure.  Reagann Dolce Curt Bears, MD 11/26/2016 11:26 AM

## 2016-11-26 NOTE — Discharge Instructions (Signed)
Pacemaker Battery Change A pacemaker battery usually lasts 5-15 years (6-7 years on average). A few times a year, you will be asked to visit your health care provider to have a full evaluation of your pacemaker. When the battery is low, your pacemaker battery and generator will be completely replaced. Most often, this procedure is simpler than the first surgery because the wires (leads) that connect the generator to the heart are already in place. There are many things that affect how long a pacemaker battery will last, including:  The age of the pacemaker.  The number of leads you have(1, 2, or 3).  The pacemaker workload. If the pacemaker is helping the heart more often, the battery will not last as long.  Power (voltage) settings.  Tell a health care provider about:  Any allergies you have.  All medicines you are taking, including vitamins, herbs, eye drops, creams, and over-the-counter medicines.  Any problems you or family members have had with anesthetic medicines.  Any blood disorders you have.  Any surgeries you have had, especially the surgeries you have had since your last pacemaker was placed.  Any medical conditions you have.  Whether you are pregnant or may be pregnant.  Any symptoms of heart problems, such as chest pain, trouble breathing, palpitations, light-headedness, or feelings of an abnormal or irregular heartbeat.  Smoking habits. This can affect your reaction to anesthesia. What are the risks? Generally, this is a safe procedure. However, problems may occur, including:  Bleeding.  Bruising of the skin around where the surgical cut (incision) was made.  Pulling apart of the skin at the incision site.  Infection.  Nerve damage.  Injury to other organs, such as the lungs.  Allergic reaction to anesthetics or other medicines used during the procedure.  People with diabetes may have a temporary increase in blood sugar (glucose) after any surgical  procedure.  What happens before the procedure? Staying hydrated Follow instructions from your health care provider about hydration, which may include:  Up to 2 hours before the procedure - you may continue to drink clear liquids, such as water, clear fruit juice, black coffee, and plain tea.  Eating and drinking restrictions Follow instructions from your health care provider about eating and drinking restrictions, which may include:  8 hours before the procedure - stop eating heavy meals or foods such as meat, fried foods, or fatty foods.  6 hours before the procedure - stop eating light meals or foods, such as toast or cereal.  6 hours before the procedure - stop drinking milk or drinks that contain milk.  2 hours before the procedure - stop drinking clear liquids.  General instructions  Ask your health care provider about: ? Changing or stopping your regular medicines. This is especially important if you are taking diabetes medicines or blood thinners. ? Taking medicines such as aspirin and ibuprofen. These medicines can thin your blood. Do not take these medicines before your procedure if your health care provider instructs you not to. ? Taking a sip of water with any approved medicines on the morning of the procedure.  Plan to have someone take you home after the procedure. What happens during the procedure?  To reduce your risk of infection: ? Your health care team will wash or sanitize their hands. ? The skin around the area of the chest will be washed with soap. ? Hair may be removed from the surgical area.  An IV tube will be inserted into one your  veins to give you medicine and fluids.  You will be given one or more of the following: ? A medicine to help you relax (sedative). ? A medicine to numb the area where the pacemaker is located (local anesthetic).  You may be given antibiotic medicine to prevent infection.  Your health care provider will make an incision to  reopen the pocket holding the pacemaker.  The old pacemaker will be disconnected from the leads.  The leads will be tested.  If needed, the leads will be replaced. If the leads are functioning properly, the new pacemaker will be connected to the existing leads.  A heart monitor and the pacemaker programmer will be used to make sure that the newly implanted pacemaker is working properly.  The incision site will be closed. A bandage (dressing) will be placed over the pacemaker site. The procedure may vary among health care providers and hospitals. What happens after the procedure?  Your blood pressure, heart rate, breathing rate, and blood oxygen level will be monitored until your health care team is satisfied that your pacemaker is working properly.  Your health care provider will tell you when your pacemaker will need to be tested again, or when to return to the office for removal of dressing and stitches.  Do not drive for 24 hours if you were given a sedative.  The dressing will be removed 24-48 hours after the procedure, or as told by your health care provider. Summary  A pacemaker battery usually lasts 5-15 years (6-7 years on average).  When the battery is low, your pacemaker battery and generator will be completely replaced.  Risks of this procedure include bleeding, bruising, infection, damage to other structures, pulling apart of the skin at the incision site, and allergic reactions to medicines or anesthetics.  Most often, this procedure is simpler than the first surgery because the wires (leads) that connect the generator to the heart are already in place. This information is not intended to replace advice given to you by your health care provider. Make sure you discuss any questions you have with your health care provider. Document Released: 06/24/2006 Document Revised: 02/18/2016 Document Reviewed: 02/18/2016 Elsevier Interactive Patient Education  2017 [employer].

## 2016-11-27 ENCOUNTER — Encounter (HOSPITAL_COMMUNITY): Payer: Self-pay | Admitting: Cardiology

## 2016-11-27 MED FILL — Gentamicin Sulfate Inj 40 MG/ML: INTRAMUSCULAR | Qty: 2 | Status: AC

## 2016-11-27 MED FILL — Sodium Chloride Irrigation Soln 0.9%: Qty: 500 | Status: AC

## 2016-12-10 ENCOUNTER — Ambulatory Visit (INDEPENDENT_AMBULATORY_CARE_PROVIDER_SITE_OTHER): Payer: Medicare Other | Admitting: *Deleted

## 2016-12-10 DIAGNOSIS — I442 Atrioventricular block, complete: Secondary | ICD-10-CM | POA: Diagnosis not present

## 2016-12-10 LAB — CUP PACEART INCLINIC DEVICE CHECK
Battery Voltage: 2.79 V
Brady Statistic AP VS Percent: 0 %
Brady Statistic AS VP Percent: 99 %
Date Time Interrogation Session: 20180913094945
Implantable Lead Location: 753859
Implantable Lead Location: 753860
Lead Channel Pacing Threshold Amplitude: 0.5 V
Lead Channel Pacing Threshold Amplitude: 0.5 V
Lead Channel Pacing Threshold Pulse Width: 0.4 ms
Lead Channel Pacing Threshold Pulse Width: 0.46 ms
Lead Channel Setting Pacing Amplitude: 2.5 V
Lead Channel Setting Pacing Pulse Width: 0.46 ms
Lead Channel Setting Sensing Sensitivity: 4 mV
MDC IDC LEAD IMPLANT DT: 20031112
MDC IDC LEAD IMPLANT DT: 20031112
MDC IDC MSMT BATTERY IMPEDANCE: 100 Ohm
MDC IDC MSMT BATTERY REMAINING LONGEVITY: 147 mo
MDC IDC MSMT LEADCHNL RA IMPEDANCE VALUE: 580 Ohm
MDC IDC MSMT LEADCHNL RA SENSING INTR AMPL: 2 mV
MDC IDC MSMT LEADCHNL RV IMPEDANCE VALUE: 986 Ohm
MDC IDC PG IMPLANT DT: 20180830
MDC IDC SET LEADCHNL RA PACING AMPLITUDE: 1.5 V
MDC IDC STAT BRADY AP VP PERCENT: 1 %
MDC IDC STAT BRADY AS VS PERCENT: 0 %

## 2016-12-10 NOTE — Progress Notes (Signed)
Wound check appointment s/p gen change. Steri-strips removed. Wound without redness or edema. Incision edges approximated, wound well healed. Normal device function. Thresholds, sensing, and impedances consistent with implant measurements. Device programmed at chronic outputs s/p gen change. Histogram distribution appropriate for patient and level of activity. 12 mode switches, No available EGMs, all episodes <1 minute. No high ventricular rates noted. Patient educated about wound care, arm mobility, lifting restrictions. ROV with WC 02/23/17.

## 2016-12-14 ENCOUNTER — Telehealth: Payer: Self-pay | Admitting: *Deleted

## 2016-12-14 NOTE — Telephone Encounter (Signed)
-----   Message from Will Meredith Leeds, MD sent at 12/10/2016  3:44 PM EDT ----- Should be fine to travel.  WC ----- Message ----- From: Jacklynn Ganong, RN Sent: 12/10/2016   9:54 AM To: Constance Haw, MD, Stanton Kidney, RN  Patient and Patient's daughter inquiring about traveling via airplane to [place] for 10 days at the end of the month s/p gen change 11/26/16.

## 2016-12-14 NOTE — Telephone Encounter (Signed)
Left detailed message on cell phone (DPR) regarding traveling to [place] at the end of the month s/p gen change. Per WC patient should be fine to travel. [facility] phone number to call back with questions or concerns. ROV with WC 02/23/17.

## 2017-01-12 DIAGNOSIS — H3561 Retinal hemorrhage, right eye: Secondary | ICD-10-CM | POA: Diagnosis not present

## 2017-01-12 DIAGNOSIS — D3142 Benign neoplasm of left ciliary body: Secondary | ICD-10-CM | POA: Diagnosis not present

## 2017-01-12 DIAGNOSIS — H52221 Regular astigmatism, right eye: Secondary | ICD-10-CM | POA: Diagnosis not present

## 2017-01-12 DIAGNOSIS — H524 Presbyopia: Secondary | ICD-10-CM | POA: Diagnosis not present

## 2017-01-12 DIAGNOSIS — H01004 Unspecified blepharitis left upper eyelid: Secondary | ICD-10-CM | POA: Diagnosis not present

## 2017-01-12 DIAGNOSIS — H01001 Unspecified blepharitis right upper eyelid: Secondary | ICD-10-CM | POA: Diagnosis not present

## 2017-01-12 DIAGNOSIS — H5201 Hypermetropia, right eye: Secondary | ICD-10-CM | POA: Diagnosis not present

## 2017-01-12 DIAGNOSIS — H5202 Hypermetropia, left eye: Secondary | ICD-10-CM | POA: Diagnosis not present

## 2017-01-12 DIAGNOSIS — H401133 Primary open-angle glaucoma, bilateral, severe stage: Secondary | ICD-10-CM | POA: Diagnosis not present

## 2017-02-23 ENCOUNTER — Encounter (INDEPENDENT_AMBULATORY_CARE_PROVIDER_SITE_OTHER): Payer: Self-pay

## 2017-02-23 ENCOUNTER — Ambulatory Visit (INDEPENDENT_AMBULATORY_CARE_PROVIDER_SITE_OTHER): Payer: Medicare Other | Admitting: Cardiology

## 2017-02-23 ENCOUNTER — Encounter: Payer: Self-pay | Admitting: Cardiology

## 2017-02-23 VITALS — BP 102/62 | HR 80 | Ht <= 58 in | Wt 116.0 lb

## 2017-02-23 DIAGNOSIS — I495 Sick sinus syndrome: Secondary | ICD-10-CM

## 2017-02-23 DIAGNOSIS — E785 Hyperlipidemia, unspecified: Secondary | ICD-10-CM | POA: Diagnosis not present

## 2017-02-23 DIAGNOSIS — Z45018 Encounter for adjustment and management of other part of cardiac pacemaker: Secondary | ICD-10-CM | POA: Diagnosis not present

## 2017-02-23 LAB — CUP PACEART INCLINIC DEVICE CHECK
Battery Impedance: 100 Ohm
Battery Remaining Longevity: 148 mo
Battery Voltage: 2.79 V
Brady Statistic AP VP Percent: 3 %
Brady Statistic AS VS Percent: 0 %
Date Time Interrogation Session: 20181127123733
Implantable Lead Location: 753859
Implantable Lead Model: 4592
Implantable Pulse Generator Implant Date: 20180830
Lead Channel Pacing Threshold Amplitude: 0.625 V
Lead Channel Pacing Threshold Amplitude: 0.75 V
Lead Channel Pacing Threshold Pulse Width: 0.4 ms
Lead Channel Pacing Threshold Pulse Width: 0.4 ms
Lead Channel Pacing Threshold Pulse Width: 0.4 ms
Lead Channel Sensing Intrinsic Amplitude: 2 mV
Lead Channel Setting Pacing Pulse Width: 0.4 ms
MDC IDC LEAD IMPLANT DT: 20031112
MDC IDC LEAD IMPLANT DT: 20031112
MDC IDC LEAD LOCATION: 753860
MDC IDC MSMT LEADCHNL RA IMPEDANCE VALUE: 630 Ohm
MDC IDC MSMT LEADCHNL RV IMPEDANCE VALUE: 983 Ohm
MDC IDC MSMT LEADCHNL RV PACING THRESHOLD AMPLITUDE: 0.375 V
MDC IDC MSMT LEADCHNL RV PACING THRESHOLD AMPLITUDE: 0.5 V
MDC IDC MSMT LEADCHNL RV PACING THRESHOLD PULSEWIDTH: 0.4 ms
MDC IDC SET LEADCHNL RA PACING AMPLITUDE: 1.5 V
MDC IDC SET LEADCHNL RV PACING AMPLITUDE: 2.5 V
MDC IDC SET LEADCHNL RV SENSING SENSITIVITY: 4 mV
MDC IDC STAT BRADY AP VS PERCENT: 0 %
MDC IDC STAT BRADY AS VP PERCENT: 97 %

## 2017-02-23 NOTE — Patient Instructions (Signed)
Medication Instructions:  Your physician recommends that you continue on your current medications as directed. Please refer to the Current Medication list given to you today.  *If you need a refill on your cardiac medications before your next appointment, please call your pharmacy*  Labwork: None ordered  Testing/Procedures: None ordered  Follow-Up: Remote monitoring is used to monitor your Pacemaker or ICD from home. This monitoring reduces the number of office visits required to check your device to one time per year. It allows [place] to keep an eye on the functioning of your device to ensure it is working properly. You are scheduled for a device check from home on 05/25/2017. You may send your transmission at any time that day. If you have a wireless device, the transmission will be sent automatically. After your physician reviews your transmission, you will receive a postcard with your next transmission date.  Your physician wants you to follow-up in: 9 months with Dr. Curt Bears.  You will receive a reminder letter in the mail two months in advance. If you don't receive a letter, please call our office to schedule the follow-up appointment.  Thank you for choosing [facility]!!   Trinidad Curet, RN [phone]

## 2017-02-23 NOTE — Progress Notes (Signed)
Electrophysiology Office Note   Date:  02/23/2017   ID:  Gloria Gonzalez, DOB [birth year], MRN [id number]  PCP:  Hoyt Koch, MD   Primary Electrophysiologist:  Constance Haw, MD    Chief Complaint  Patient presents with  . Pacemaker Check    Complete heart block     History of Present Illness: Gloria Gonzalez is a 83 y.o. female who presents today for electrophysiology evaluation.   She has a pacemaker which was placed in 2003.  The device was implanted for an irregular rhythm, likely some sort of heart block.  She had a Medtronic generator change on 11/26/16.   Today, denies symptoms of palpitations, chest pain, shortness of breath, orthopnea, PND, lower extremity edema, claudication, dizziness, presyncope, syncope, bleeding, or neurologic sequela. The patient is tolerating medications without difficulties. She has been feeling well without issues. Having no chest pain or SOB. .     Past Medical History:  Diagnosis Date  . Arrhythmia   . Arthritis   . Glaucoma   . Hyperlipidemia   . Thyroid disease    Past Surgical History:  Procedure Laterality Date  . EYE SURGERY    . PACEMAKER INSERTION  2003  . PPM GENERATOR CHANGEOUT N/A 11/26/2016   Procedure: PPM GENERATOR CHANGEOUT;  Surgeon: Constance Haw, MD;  Location: [facility];  Service: Cardiovascular;  Laterality: N/A;     Current Outpatient Medications  Medication Sig Dispense Refill  . atorvastatin (LIPITOR) 20 MG tablet Take 1 tablet (20 mg total) by mouth daily at 6 PM. 180 tablet 3  . Calcium Citrate-Vitamin D (CALCIUM + D PO) Take 1 tablet by mouth 2 (two) times daily.     . carboxymethylcellulose (REFRESH PLUS) 0.5 % SOLN Place 1 drop into both eyes 4 (four) times daily. For dry eyes     . Eyelid Cleansers (OCUSOFT BABY EYELID & EYELASH) PADS Apply 1 each to eye 2 (two) times daily.     . Glucosamine-Chondroitin (COSAMIN DS PO) Take 1 tablet by mouth 2 (two) times daily.    .  Multiple Vitamins-Minerals (PRESERVISION AREDS 2) CAPS Take 1 capsule by mouth 2 (two) times daily.    Marland Kitchen omega-3 acid ethyl esters (LOVAZA) 1 g capsule Take 1 capsule (1 g total) by mouth 2 (two) times daily. 180 capsule 3  . raloxifene (EVISTA) 60 MG tablet Take 1 tablet (60 mg total) by mouth daily. (Patient taking differently: Take 60 mg by mouth every evening. ) 90 tablet 3  . SYNTHROID 50 MCG tablet Take 1 tablet (50 mcg total) by mouth daily before breakfast. 90 tablet 3   No current facility-administered medications for this visit.     Allergies:   Patient has no known allergies.   Social History:  The patient  reports that  has never smoked. she has never used smokeless tobacco. She reports that she does not drink alcohol or use drugs.   Family History:  [name] family history includes Arthritis in her mother; Heart disease in her brother and sister.   ROS:  Please see the history of present illness.   Otherwise, review of systems is positive for leg pain, visual changes, balance problems.   All other systems are reviewed and negative.   PHYSICAL EXAM: VS:  BP 102/62   Pulse 80   Ht 4\' 10"  (1.473 m)   Wt 116 lb (52.6 kg)   BMI 24.24 kg/m  , BMI Body mass index is 24.24 kg/m.  GEN: Well nourished, well developed, in no acute distress  HEENT: normal  Neck: no JVD, carotid bruits, or masses Cardiac: RRR; no murmurs, rubs, or gallops,no edema  Respiratory:  clear to auscultation bilaterally, normal work of breathing GI: soft, nontender, nondistended, + BS MS: no deformity or atrophy  Skin: warm and dry, device site well healed Neuro:  Strength and sensation are intact Psych: euthymic mood, full affect  EKG:  EKG is ordered today. Personal review of the ekg ordered shows A sense, V pace  Personal review of the device interrogation today. Results in [facility]: 06/23/2016: ALT 11 11/19/2016: BUN 36; Creatinine, Ser 1.33; Hemoglobin 13.5; Platelets 342;  Potassium 4.8; Sodium 139    Lipid Panel     Component Value Date/Time   CHOL 126 06/23/2016 1321   TRIG 312.0 (H) 06/23/2016 1321   HDL 32.10 (L) 06/23/2016 1321   CHOLHDL 4 06/23/2016 1321   VLDL 62.4 (H) 06/23/2016 1321   LDLDIRECT 61.0 06/23/2016 1321     Wt Readings from Last 3 Encounters:  02/23/17 116 lb (52.6 kg)  11/26/16 112 lb (50.8 kg)  11/19/16 112 lb (50.8 kg)   ASSESSMENT AND PLAN:  1.  Complete heart block: She had a Medtronic dual-chamber pacemaker generator change on 11/26/16. Device functioning appropriately. No changes at this time.  2. Hyperlipidemia: continue atorvastatin.  Current medicines are reviewed at length with the patient today.   The patient does not have concerns regarding her medicines.  The following changes were made today:    Labs/ tests ordered today include: none  Orders Placed This Encounter  Procedures  . EKG 12-Lead     Disposition:   FU with Gloria Gonzalez 1 year  Signed, Kayode Petion Meredith Leeds, MD  02/23/2017 11:43 AM     [place] [address] [place] [address] [phone] (office) [phone] (fax)

## 2017-02-25 ENCOUNTER — Ambulatory Visit (INDEPENDENT_AMBULATORY_CARE_PROVIDER_SITE_OTHER): Payer: Medicare Other

## 2017-02-25 DIAGNOSIS — Z23 Encounter for immunization: Secondary | ICD-10-CM

## 2017-05-16 NOTE — Progress Notes (Signed)
Electrophysiology Office Note   Date:  05/17/2017   ID:  Gloria Gonzalez, DOB 11/24/1921, MRN [id number]  PCP:  Hoyt Koch, MD   Primary Electrophysiologist:  Constance Haw, MD    Chief Complaint  Patient presents with  . Pacemaker Check    Sick sinus syndrome     History of Present Illness: Gloria Gonzalez is a 83 y.o. female who presents today for electrophysiology evaluation.   She has a pacemaker which was placed in 2003.  The device was implanted for an irregular rhythm, likely some sort of heart block.  She had a Medtronic generator change on 11/26/16.   Today, denies symptoms of palpitations, chest pain, shortness of breath, orthopnea, PND, lower extremity edema, claudication, dizziness, presyncope, syncope, bleeding, or neurologic sequela. The patient is tolerating medications without difficulties.  She is currently feeling well without major issues.  She has not had any problems with chest pain or shortness of breath.  She did have a fall and has a bruise on her back.  She was called in due to the Medtronic programming recall.  She is set to DDD and she is dependent.  She has had no episodes of syncope.   Past Medical History:  Diagnosis Date  . Arrhythmia   . Arthritis   . Glaucoma   . Hyperlipidemia   . Thyroid disease    Past Surgical History:  Procedure Laterality Date  . EYE SURGERY    . PACEMAKER INSERTION  2003  . PPM GENERATOR CHANGEOUT N/A 11/26/2016   Procedure: PPM GENERATOR CHANGEOUT;  Surgeon: Constance Haw, MD;  Location: [facility];  Service: Cardiovascular;  Laterality: N/A;     Current Outpatient Medications  Medication Sig Dispense Refill  . atorvastatin (LIPITOR) 20 MG tablet Take 1 tablet (20 mg total) by mouth daily at 6 PM. 180 tablet 3  . Calcium Citrate-Vitamin D (CALCIUM + D PO) Take 1 tablet by mouth 2 (two) times daily.     . carboxymethylcellulose (REFRESH PLUS) 0.5 % SOLN Place 1 drop into both eyes 4  (four) times daily. For dry eyes     . Eyelid Cleansers (OCUSOFT BABY EYELID & EYELASH) PADS Apply 1 each to eye 2 (two) times daily.     . Glucosamine-Chondroitin (COSAMIN DS PO) Take 1 tablet by mouth 2 (two) times daily.    . Multiple Vitamins-Minerals (PRESERVISION AREDS 2) CAPS Take 1 capsule by mouth 2 (two) times daily.    Marland Kitchen omega-3 acid ethyl esters (LOVAZA) 1 g capsule Take 1 capsule (1 g total) by mouth 2 (two) times daily. 180 capsule 3  . raloxifene (EVISTA) 60 MG tablet Take 1 tablet (60 mg total) by mouth daily. (Patient taking differently: Take 60 mg by mouth every evening. ) 90 tablet 3  . SYNTHROID 50 MCG tablet Take 1 tablet (50 mcg total) by mouth daily before breakfast. 90 tablet 3   No current facility-administered medications for this visit.     Allergies:   Patient has no known allergies.   Social History:  The patient  reports that  has never smoked. she has never used smokeless tobacco. She reports that she does not drink alcohol or use drugs.   Family History:  [name] family history includes Arthritis in her mother; Heart disease in her brother and sister.   ROS:  Please see the history of present illness.   Otherwise, review of systems is positive for balance problems, back pain.  All other systems are reviewed and negative.   PHYSICAL EXAM: VS:  BP (!) 118/58   Pulse 89   Ht 4\' 10"  (1.473 m)   Wt 116 lb 3.2 oz (52.7 kg)   BMI 24.29 kg/m  , BMI Body mass index is 24.29 kg/m. GEN: Well nourished, well developed, in no acute distress  HEENT: normal  Neck: no JVD, carotid bruits, or masses Cardiac: RRR; no murmurs, rubs, or gallops,no edema  Respiratory:  clear to auscultation bilaterally, normal work of breathing GI: soft, nontender, nondistended, + BS MS: no deformity or atrophy  Skin: warm and dry, device site well healed Neuro:  Strength and sensation are intact Psych: euthymic mood, full affect  EKG:  EKG is not ordered today. Personal  review of the ekg ordered 02/23/17 shows A sense,V paced  Personal review of the device interrogation today. Results in [facility]: 06/23/2016: ALT 11 11/19/2016: BUN 36; Creatinine, Ser 1.33; Hemoglobin 13.5; Platelets 342; Potassium 4.8; Sodium 139    Lipid Panel     Component Value Date/Time   CHOL 126 06/23/2016 1321   TRIG 312.0 (H) 06/23/2016 1321   HDL 32.10 (L) 06/23/2016 1321   CHOLHDL 4 06/23/2016 1321   VLDL 62.4 (H) 06/23/2016 1321   LDLDIRECT 61.0 06/23/2016 1321     Wt Readings from Last 3 Encounters:  05/17/17 116 lb 3.2 oz (52.7 kg)  02/23/17 116 lb (52.6 kg)  11/26/16 112 lb (50.8 kg)   ASSESSMENT AND PLAN:  1.  Complete heart block: Medtronic dual-chamber generator change 11/26/16.  Unfortunately, there is a recall on her device, and as she is dependent, needed adjustment and programming.  She was set to DVI.  I told him that if she feels worse, she would be eligible for generator change.  Otherwise we Tephanie Escorcia wait for the Medtronic patch which should be coming out this fall to fix the programming issue.  2. Hyperlipidemia: Continue atorvastatin  Current medicines are reviewed at length with the patient today.   The patient does not have concerns regarding her medicines.  The following changes were made today: None  Labs/ tests ordered today include: none  No orders of the defined types were placed in this encounter.    Disposition:   FU with Pualani Borah 6 months  Signed, Steve Youngberg Meredith Leeds, MD  05/17/2017 2:12 PM     [address] [place] [address] [phone] (office) [phone] (fax)

## 2017-05-17 ENCOUNTER — Encounter: Payer: Self-pay | Admitting: Cardiology

## 2017-05-17 ENCOUNTER — Ambulatory Visit (INDEPENDENT_AMBULATORY_CARE_PROVIDER_SITE_OTHER): Payer: Medicare Other | Admitting: Cardiology

## 2017-05-17 VITALS — BP 118/58 | HR 89 | Ht <= 58 in | Wt 116.2 lb

## 2017-05-17 DIAGNOSIS — I442 Atrioventricular block, complete: Secondary | ICD-10-CM | POA: Diagnosis not present

## 2017-05-17 DIAGNOSIS — E785 Hyperlipidemia, unspecified: Secondary | ICD-10-CM | POA: Diagnosis not present

## 2017-05-17 DIAGNOSIS — I495 Sick sinus syndrome: Secondary | ICD-10-CM

## 2017-05-17 LAB — CUP PACEART INCLINIC DEVICE CHECK
Battery Impedance: 100 Ohm
Battery Remaining Longevity: 145 mo
Battery Voltage: 2.79 V
Brady Statistic AP VS Percent: 0 %
Brady Statistic AS VP Percent: 96 %
Date Time Interrogation Session: 20190218144738
Implantable Lead Implant Date: 20031112
Implantable Lead Location: 753860
Implantable Lead Model: 4092
Implantable Pulse Generator Implant Date: 20180830
Lead Channel Impedance Value: 554 Ohm
Lead Channel Setting Pacing Amplitude: 2 V
Lead Channel Setting Pacing Amplitude: 2.5 V
Lead Channel Setting Sensing Sensitivity: 4 mV
MDC IDC LEAD IMPLANT DT: 20031112
MDC IDC LEAD LOCATION: 753859
MDC IDC MSMT LEADCHNL RV IMPEDANCE VALUE: 869 Ohm
MDC IDC MSMT LEADCHNL RV PACING THRESHOLD AMPLITUDE: 0.375 V
MDC IDC MSMT LEADCHNL RV PACING THRESHOLD PULSEWIDTH: 0.4 ms
MDC IDC SET LEADCHNL RV PACING PULSEWIDTH: 0.4 ms
MDC IDC STAT BRADY AP VP PERCENT: 4 %
MDC IDC STAT BRADY AS VS PERCENT: 0 %

## 2017-05-17 NOTE — Patient Instructions (Signed)
Medication Instructions:  Your physician recommends that you continue on your current medications as directed. Please refer to the Current Medication list given to you today.  *If you need a refill on your cardiac medications before your next appointment, please call your pharmacy*  Labwork: None ordered  Testing/Procedures: None ordered  Follow-Up: Remote monitoring is used to monitor your Pacemaker or ICD from home. This monitoring reduces the number of office visits required to check your device to one time per year. It allows [place] to keep an eye on the functioning of your device to ensure it is working properly. You are scheduled for a device check from home on 05/25/2017. You may send your transmission at any time that day. If you have a wireless device, the transmission will be sent automatically. After your physician reviews your transmission, you will receive a postcard with your next transmission date.  Your physician wants you to follow-up in: 6 months with Dr. Curt Bears.  You will receive a reminder letter in the mail two months in advance. If you don't receive a letter, please call our office to schedule the follow-up appointment.  Thank you for choosing [facility]!!   Trinidad Curet, RN [phone]

## 2017-05-25 ENCOUNTER — Ambulatory Visit (INDEPENDENT_AMBULATORY_CARE_PROVIDER_SITE_OTHER): Payer: Medicare Other | Admitting: *Deleted

## 2017-05-25 DIAGNOSIS — I495 Sick sinus syndrome: Secondary | ICD-10-CM | POA: Diagnosis not present

## 2017-05-26 NOTE — Progress Notes (Signed)
Remote pacemaker transmission.   

## 2017-05-27 ENCOUNTER — Encounter: Payer: Self-pay | Admitting: Cardiology

## 2017-06-07 LAB — CUP PACEART REMOTE DEVICE CHECK
Battery Remaining Longevity: 135 mo
Battery Voltage: 2.79 V
Brady Statistic AP VS Percent: 0 %
Brady Statistic AS VS Percent: 0 %
Implantable Lead Implant Date: 20031112
Implantable Lead Location: 753859
Implantable Pulse Generator Implant Date: 20180830
Lead Channel Impedance Value: 572 Ohm
Lead Channel Impedance Value: 950 Ohm
Lead Channel Pacing Threshold Amplitude: 0.375 V
Lead Channel Pacing Threshold Pulse Width: 0.4 ms
Lead Channel Setting Sensing Sensitivity: 4 mV
MDC IDC LEAD IMPLANT DT: 20031112
MDC IDC LEAD LOCATION: 753860
MDC IDC MSMT BATTERY IMPEDANCE: 100 Ohm
MDC IDC SESS DTM: 20190226184453
MDC IDC SET LEADCHNL RA PACING AMPLITUDE: 2 V
MDC IDC SET LEADCHNL RV PACING AMPLITUDE: 2.5 V
MDC IDC SET LEADCHNL RV PACING PULSEWIDTH: 0.4 ms
MDC IDC STAT BRADY AP VP PERCENT: 100 %
MDC IDC STAT BRADY AS VP PERCENT: 0 %

## 2017-06-14 ENCOUNTER — Telehealth: Payer: Self-pay | Admitting: Cardiology

## 2017-06-14 ENCOUNTER — Ambulatory Visit: Payer: Self-pay | Admitting: *Deleted

## 2017-06-14 NOTE — Telephone Encounter (Signed)
Pt's daughter, Joycelyn Schmid calling with the pt beside her with complaints of bilateral swelling in feet and ankles for the past 2 weeks. Pt denies any shortness of breath of chest pain associated with the swelling. Pt's daughter states that cardiologist was contacted initially regarding swelling and the nurse at the office referred the pt to contact the PCP. Pt's daughter states that there was a recall on the pt's pacemaker and the pt went in to have the pacemaker reprogrammed approximately 4 weeks ago and and since that procedure the pt has experienced an increase of swelling.Pt also having a productive cough with green sputum, stuffy nose and sore throat for approximately a week. Pt's daughter states that the whole family has been sick as well.Pt's daughter advised to try over the counter medications to treat symptoms. Appt made for 3/20 and notified pt and her daughter to contact the office if symptoms worsen before appt. Understanding verbalized.  Reason for Disposition . [1] MILD swelling of both ankles (i.e., pedal edema) AND [2] new onset or worsening . Cold with no complications  Answer Assessment - Initial Assessment Questions 1. ONSET: "When did the swelling start?" (e.g., minutes, hours, days)     Approximately 2 weeks ago 2. LOCATION: "What part of the leg is swollen?"  "Are both legs swollen or just one leg?"     Ankles and feet 3. SEVERITY: "How bad is the swelling?" (e.g., localized; mild, moderate, severe)  - Localized - small area of swelling localized to one leg  - MILD pedal edema - swelling limited to foot and ankle, pitting edema < 1/4 inch (6 mm) deep, rest and elevation eliminate most or all swelling  - MODERATE edema - swelling of lower leg to knee, pitting edema > 1/4 inch (6 mm) deep, rest and elevation only partially reduce swelling  - SEVERE edema - swelling extends above knee, facial or hand swelling present     Mild  4. REDNESS: "Does the swelling look red or infected?"   No 5. PAIN: "Is the swelling painful to touch?" If so, ask: "How painful is it?"   (Scale 1-10; mild, moderate or severe)     Hurts with flexing  6. FEVER: "Do you have a fever?" If so, ask: "What is it, how was it measured, and when did it start?"      No 7. CAUSE: "What do you think is causing the leg swelling?"     unsure 8. MEDICAL HISTORY: "Do you have a history of heart failure, kidney disease, liver failure, or cancer?"     Heart failure, pacemaker 9. RECURRENT SYMPTOM: "Have you had leg swelling before?" If so, ask: "When was the last time?" "What happened that time?"     Not this severe 10. OTHER SYMPTOMS: "Do you have any other symptoms?" (e.g., chest pain, difficulty breathing)  No  Answer Assessment - Initial Assessment Questions 1. ONSET: "When did the nasal discharge start?"      About a week ago 2. AMOUNT: "How much discharge is there?"       3. COUGH: "Do you have a cough?" If yes, ask: "Describe the color of your sputum" (clear, white, yellow, green)     Green productive cough 4. RESPIRATORY DISTRESS: "Describe your breathing."      No 5. FEVER: "Do you have a fever?" If so, ask: "What is your temperature, how was it measured, and when did it start?"     No 6. SEVERITY: "Overall, how bad are you feeling  right now?" (e.g., doesn't interfere with normal activities, staying home from school/work, staying in bed)      7. OTHER SYMPTOMS: "Do you have any other symptoms?" (e.g., sore throat, earache, wheezing, vomiting)     Sore throat, stuffy nose 8. PREGNANCY: "Is there any chance you are pregnant?" "When was your last menstrual period?"     n/a  Protocols used: LEG SWELLING AND EDEMA-A-AH, COMMON COLD-A-AH

## 2017-06-14 NOTE — Telephone Encounter (Addendum)
Informed dtr to keep f/u appt on Thursday, with Chanetta Marshall NP (per Dr. Curt Bears) Dtr is agreeable to plan.

## 2017-06-14 NOTE — Telephone Encounter (Signed)
I spoke with pt's daughter.  She reports family has been sick with cold, runny nose.  Pt also has runny dose.  Started around March 9. Daughter reports pt has a little shortness of breath but they think this is due to her cold.  She has poor circulation to her feet. Daughter reports pt has swelling in her feet. Thinks it has been going on for a few weeks but pt just mentioned it to her yesterday.  I advised her to contact primary care for evaluation of this and on going cold symptoms. I asked her to have pt keep feet elevated when possible. Daughter is concerned swelling could be related to pacemaker issues.   I told her I would send to Dr. Curt Bears for review.  Scheduling has made an appointment for pt to see Chanetta Marshall, NP for evaluation of swelling.  Pt will plan on keeping this appointment unless Dr. Curt Bears feels it is not needed.

## 2017-06-14 NOTE — Telephone Encounter (Signed)
New message     Pt c/o swelling: STAT is pt has developed SOB within 24 hours  1) How much weight have you gained and in what time span? N/A  2) If swelling, where is the swelling located? FEET, ANKLES  3) Are you currently taking a fluid pill? NO  4) Are you currently SOB? YES  5) Do you have a log of your daily weights (if so, list)? NO  6) Have you gained 3 pounds in a day or 5 pounds in a week? N/A  7) Have you traveled recently? NO

## 2017-06-16 ENCOUNTER — Ambulatory Visit: Payer: Medicare Other | Admitting: Internal Medicine

## 2017-06-16 NOTE — Progress Notes (Signed)
Electrophysiology Office Note Date: 06/17/2017  ID:  Gloria Gonzalez, DOB [birth year], MRN [id number]  PCP: Hoyt Koch, MD Electrophysiologist: Curt Bears  CC: Pacemaker follow-up  Gloria Gonzalez is a 84 y.o. female seen today for Dr Curt Bears. She had her initial PPM implant in 2003 while living in [place]. She moved to [place] to be closer to her daughter and granddaughter. She underwent generator change in 2018.  Her device is on advisory with a software patch anticipated later this year. At last visit with Dr Curt Bears, her device was reprogrammed to DVI. Since that time, she has developed LE edema for which she presents today.  She has noticed some LE edema that resolves with elevation. She has not had shortness of breath, orthopnea, or PND. She remains fairly active participating in exercise class. She denies chest pain, palpitations, dyspnea, PND, orthopnea, nausea, vomiting, dizziness, syncope, edema, weight gain, or early satiety.  Device History: MDT dual chamber PPM implanted 2003 for complete heart block, gen change 2018   Past Medical History:  Diagnosis Date  . Arrhythmia   . Arthritis   . Complete heart block ([place])   . Glaucoma   . Hyperlipidemia   . Thyroid disease    Past Surgical History:  Procedure Laterality Date  . EYE SURGERY    . PACEMAKER INSERTION  2003  . PPM GENERATOR CHANGEOUT N/A 11/26/2016   Procedure: PPM GENERATOR CHANGEOUT;  Surgeon: Constance Haw, MD;  Location: [facility];  Service: Cardiovascular;  Laterality: N/A;    Current Outpatient Medications  Medication Sig Dispense Refill  . atorvastatin (LIPITOR) 20 MG tablet Take 1 tablet (20 mg total) by mouth daily at 6 PM. 180 tablet 3  . Calcium Citrate-Vitamin D (CALCIUM + D PO) Take 1 tablet by mouth 2 (two) times daily.     . Eyelid Cleansers (OCUSOFT BABY EYELID & EYELASH) PADS Apply 1 each to eye 2 (two) times daily.     . Glucosamine-Chondroitin (COSAMIN DS PO) Take 1  tablet by mouth 2 (two) times daily.    . Multiple Vitamins-Minerals (PRESERVISION AREDS 2) CAPS Take 1 capsule by mouth 2 (two) times daily.    Marland Kitchen omega-3 acid ethyl esters (LOVAZA) 1 g capsule Take 1 capsule (1 g total) by mouth 2 (two) times daily. 180 capsule 3  . raloxifene (EVISTA) 60 MG tablet Take 1 tablet (60 mg total) by mouth daily. (Patient taking differently: Take 60 mg by mouth every evening. ) 90 tablet 3  . SYNTHROID 50 MCG tablet Take 1 tablet (50 mcg total) by mouth daily before breakfast. 90 tablet 3   No current facility-administered medications for this visit.     Allergies:   Patient has no known allergies.   Social History: Social History   Socioeconomic History  . Marital status: Married    Spouse name: Not on file  . Number of children: Not on file  . Years of education: Not on file  . Highest education level: Not on file  Occupational History  . Not on file  Social Needs  . Financial resource strain: Not on file  . Food insecurity:    Worry: Not on file    Inability: Not on file  . Transportation needs:    Medical: Not on file    Non-medical: Not on file  Tobacco Use  . Smoking status: Never Smoker  . Smokeless tobacco: Never Used  Substance and Sexual Activity  . Alcohol use: No  Alcohol/week: 0.0 oz  . Drug use: No  . Sexual activity: Not on file  Lifestyle  . Physical activity:    Days per week: Not on file    Minutes per session: Not on file  . Stress: Not on file  Relationships  . Social connections:    Talks on phone: Not on file    Gets together: Not on file    Attends religious service: Not on file    Active member of club or organization: Not on file    Attends meetings of clubs or organizations: Not on file    Relationship status: Not on file  . Intimate partner violence:    Fear of current or ex partner: Not on file    Emotionally abused: Not on file    Physically abused: Not on file    Forced sexual activity: Not on file    Other Topics Concern  . Not on file  Social History Narrative  . Not on file    Family History: Family History  Problem Relation Age of Onset  . Arthritis Mother   . Heart disease Sister   . Heart disease Brother      Review of Systems: All other systems reviewed and are otherwise negative except as noted above.   Physical Exam: VS:  BP (!) 96/40   Pulse 78   Ht 4\' 10"  (1.473 m)   Wt 113 lb 3.2 oz (51.3 kg)   SpO2 91%   BMI 23.66 kg/m  , BMI Body mass index is 23.66 kg/m.  GEN- The patient is elderly and frail appearing, alert and oriented x 3 today.   HEENT: normocephalic, atraumatic; sclera clear, conjunctiva pink; hearing intact; oropharynx clear; neck supple  Lungs- Clear to ausculation bilaterally, normal work of breathing.  No wheezes, rales, rhonchi Heart- Regular rate and rhythm (paced) GI- soft, non-tender, non-distended, bowel sounds present  Extremities- no clubbing, cyanosis, or edema  MS- no significant deformity or atrophy Skin- warm and dry, no rash or lesion; PPM pocket well healed Psych- euthymic mood, full affect Neuro- strength and sensation are intact  PPM Interrogation- reviewed in detail today,  See PACEART report  EKG:  EKG is ordered today. The ekg ordered today shows V pacing with AV dyssynchrony   Recent Labs: 06/23/2016: ALT 11 11/19/2016: BUN 36; Creatinine, Ser 1.33; Hemoglobin 13.5; Platelets 342; Potassium 4.8; Sodium 139   Wt Readings from Last 3 Encounters:  06/17/17 113 lb 3.2 oz (51.3 kg)  05/17/17 116 lb 3.2 oz (52.7 kg)  02/23/17 116 lb (52.6 kg)     Other studies Reviewed: Additional studies/ records that were reviewed today include: Dr Curt Bears' office notes  Assessment and Plan:  1.  Complete heart block  She has an Adapta pacemaker that is on advisory.  She does not have an escape rhythm today.  She was reprogrammed DVIR at last office visit with Dr Curt Bears.  She has developed some LE edema that resolves with  elevation. She has not had other HF symptoms.  We discussed extensively options today including leaving programmed DVIR and accepting LE edema with conservative treatment, reprogramming back to DDDR and accepting low risk of device malfunction or proceeding with generator change and accepting risk of procedure.  As her symptoms are minimal, she would like to leave device programmed DVIR for now. I have advised her to call back with progressive HF symptoms.  She voices understanding. Her atrial rate is >80bpm at rest and therefore she will  not have AV synchrony.  I did not think it was appropriate to overdrive her sinus rate. She is aware we will call her when software patch is released.    Current medicines are reviewed at length with the patient today.   The patient does not have concerns regarding her medicines.  The following changes were made today:  none  Labs/ tests ordered today include: none Orders Placed This Encounter  Procedures  . EKG 12-Lead     Disposition:   Follow up with [facility], Dr Curt Bears as scheduled   Signed, Chanetta Marshall, NP 06/17/2017 9:18 AM  [address] [place] [address] [phone] (office) [phone] (fax)

## 2017-06-17 ENCOUNTER — Encounter: Payer: Self-pay | Admitting: Family Medicine

## 2017-06-17 ENCOUNTER — Encounter: Payer: Self-pay | Admitting: Nurse Practitioner

## 2017-06-17 ENCOUNTER — Ambulatory Visit (INDEPENDENT_AMBULATORY_CARE_PROVIDER_SITE_OTHER): Payer: Medicare Other | Admitting: Family Medicine

## 2017-06-17 ENCOUNTER — Ambulatory Visit (INDEPENDENT_AMBULATORY_CARE_PROVIDER_SITE_OTHER): Payer: Medicare Other | Admitting: Nurse Practitioner

## 2017-06-17 ENCOUNTER — Encounter (INDEPENDENT_AMBULATORY_CARE_PROVIDER_SITE_OTHER): Payer: Self-pay

## 2017-06-17 VITALS — BP 96/40 | HR 78 | Ht <= 58 in | Wt 113.2 lb

## 2017-06-17 VITALS — BP 102/58 | HR 86 | Temp 97.6°F | Ht <= 58 in | Wt 113.0 lb

## 2017-06-17 DIAGNOSIS — J069 Acute upper respiratory infection, unspecified: Secondary | ICD-10-CM

## 2017-06-17 DIAGNOSIS — I442 Atrioventricular block, complete: Secondary | ICD-10-CM | POA: Diagnosis not present

## 2017-06-17 MED ORDER — FLUTICASONE PROPIONATE 50 MCG/ACT NA SUSP: 1.0000 | Freq: Every day | NASAL | 1 refills | Status: DC

## 2017-06-17 NOTE — Patient Instructions (Addendum)
Medication Instructions:   Your physician recommends that you continue on your current medications as directed. Please refer to the Current Medication list given to you today.   If you need a refill on your cardiac medications before your next appointment, please call your pharmacy.  Labwork: NONE ORDERED  TODAY    Testing/Procedures: NONE ORDERED  TODAY    Follow-Up:   Your physician wants you to follow-up in:  [place] will receive a reminder letter in the mail two months in advance. If you don't receive a letter, please call our office to schedule the follow-up appointment.   Remote monitoring is used to monitor your Pacemaker of ICD from home. This monitoring reduces the number of office visits required to check your device to one time per year. It allows [place] to keep an eye on the functioning of your device to ensure it is working properly. You are scheduled for a device check from home on...08-25-17..You may send your transmission at any time that day. If you have a wireless device, the transmission will be sent automatically. After your physician reviews your transmission, you will receive a postcard with your next transmission date.     Any Other Special Instructions Will Be Listed Below (If Applicable).

## 2017-06-17 NOTE — Assessment & Plan Note (Signed)
Possible for allergic or viral  - flonase  - counseled on supportive care - advised to follow up if no improvement.

## 2017-06-17 NOTE — Patient Instructions (Signed)
Try the flonase  Try use a humidifier  Please try Vick's for stuffiness  Please follow up with if symptoms don't improve.

## 2017-06-17 NOTE — Progress Notes (Signed)
Gloria Gonzalez - 84 y.o. female MRN [id number]  Date of birth: May 19, 1921  SUBJECTIVE:  Including CC & ROS.  Chief Complaint  Patient presents with  . Cough    Gloria Gonzalez is a 84 y.o. female that is presenting with a cough and congestion. She reports her stuffy nose has been occurring for three months. She was recently sick with a viral infection about two weeks ago. Has not had any improvement with home remedies. Denies wheezing or shortness of breath. Denies fever, chills or headaches. Her daughter has had similar symptoms.    Review of Systems  Constitutional: Negative for fever.  HENT: Positive for congestion.   Respiratory: Positive for cough.     HISTORY: Past Medical, Surgical, Social, and Family History Reviewed & Updated per EMR.   Pertinent Historical Findings include:  Past Medical History:  Diagnosis Date  . Arrhythmia   . Arthritis   . Complete heart block ([place])   . Glaucoma   . Hyperlipidemia   . Thyroid disease     Past Surgical History:  Procedure Laterality Date  . EYE SURGERY    . PACEMAKER INSERTION  2003  . PPM GENERATOR CHANGEOUT N/A 11/26/2016   Procedure: PPM GENERATOR CHANGEOUT;  Surgeon: Constance Haw, MD;  Location: [facility];  Service: Cardiovascular;  Laterality: N/A;    No Known Allergies  Family History  Problem Relation Age of Onset  . Arthritis Mother   . Heart disease Sister   . Heart disease Brother      Social History   Socioeconomic History  . Marital status: Married    Spouse name: Not on file  . Number of children: Not on file  . Years of education: Not on file  . Highest education level: Not on file  Occupational History  . Not on file  Social Needs  . Financial resource strain: Not on file  . Food insecurity:    Worry: Not on file    Inability: Not on file  . Transportation needs:    Medical: Not on file    Non-medical: Not on file  Tobacco Use  . Smoking status: Never Smoker  .  Smokeless tobacco: Never Used  Substance and Sexual Activity  . Alcohol use: No    Alcohol/week: 0.0 oz  . Drug use: No  . Sexual activity: Not on file  Lifestyle  . Physical activity:    Days per week: Not on file    Minutes per session: Not on file  . Stress: Not on file  Relationships  . Social connections:    Talks on phone: Not on file    Gets together: Not on file    Attends religious service: Not on file    Active member of club or organization: Not on file    Attends meetings of clubs or organizations: Not on file    Relationship status: Not on file  . Intimate partner violence:    Fear of current or ex partner: Not on file    Emotionally abused: Not on file    Physically abused: Not on file    Forced sexual activity: Not on file  Other Topics Concern  . Not on file  Social History Narrative  . Not on file     PHYSICAL EXAM:  VS: BP (!) 102/58 (BP Location: Left Arm, Patient Position: Sitting, Cuff Size: Normal)   Pulse 86   Temp 97.6 F (36.4 C) (Oral)   Ht 4\' 10"  (1.473  m)   Wt 113 lb (51.3 kg)   SpO2 95%   BMI 23.62 kg/m  Physical Exam Gen: NAD, alert, cooperative with exam, sitting in wheel chair ENT: normal lips, normal nasal mucosa, tympanic membranes clear and intact bilaterally, normal oropharynx, no cervical lymphadenopathy Eye: normal EOM, normal conjunctiva and lids CV:  no edema, +2 pedal pulses, regular rate and rhythm, S1-S2   Resp: no accessory muscle use, non-labored, clear to auscultation bilaterally, no crackles or wheezes Skin: no rashes, no areas of induration  Neuro: normal tone, normal sensation to touch Psych:  normal insight, alert and oriented MSK: Normal gait, normal strength       ASSESSMENT & PLAN:   Upper respiratory tract infection Possible for allergic or viral  - flonase  - counseled on supportive care - advised to follow up if no improvement.

## 2017-06-21 ENCOUNTER — Encounter: Payer: Self-pay | Admitting: Internal Medicine

## 2017-06-21 ENCOUNTER — Other Ambulatory Visit (INDEPENDENT_AMBULATORY_CARE_PROVIDER_SITE_OTHER): Payer: Medicare Other

## 2017-06-21 ENCOUNTER — Ambulatory Visit (INDEPENDENT_AMBULATORY_CARE_PROVIDER_SITE_OTHER): Payer: Medicare Other | Admitting: Internal Medicine

## 2017-06-21 ENCOUNTER — Other Ambulatory Visit: Payer: Self-pay | Admitting: Internal Medicine

## 2017-06-21 ENCOUNTER — Ambulatory Visit (INDEPENDENT_AMBULATORY_CARE_PROVIDER_SITE_OTHER)
Admission: RE | Admit: 2017-06-21 | Discharge: 2017-06-21 | Disposition: A | Discharge status: Home / Self Care | Payer: Medicare Other | Source: Ambulatory Visit | Attending: Internal Medicine | Admitting: Internal Medicine

## 2017-06-21 VITALS — BP 100/50 | HR 77 | Temp 97.7°F | Ht <= 58 in | Wt 112.0 lb

## 2017-06-21 DIAGNOSIS — D1801 Hemangioma of skin and subcutaneous tissue: Secondary | ICD-10-CM | POA: Diagnosis not present

## 2017-06-21 DIAGNOSIS — E039 Hypothyroidism, unspecified: Secondary | ICD-10-CM

## 2017-06-21 DIAGNOSIS — L814 Other melanin hyperpigmentation: Secondary | ICD-10-CM | POA: Diagnosis not present

## 2017-06-21 DIAGNOSIS — L853 Xerosis cutis: Secondary | ICD-10-CM | POA: Diagnosis not present

## 2017-06-21 DIAGNOSIS — E782 Mixed hyperlipidemia: Secondary | ICD-10-CM

## 2017-06-21 DIAGNOSIS — R05 Cough: Secondary | ICD-10-CM

## 2017-06-21 DIAGNOSIS — R059 Cough, unspecified: Secondary | ICD-10-CM

## 2017-06-21 DIAGNOSIS — M810 Age-related osteoporosis without current pathological fracture: Secondary | ICD-10-CM

## 2017-06-21 DIAGNOSIS — L309 Dermatitis, unspecified: Secondary | ICD-10-CM | POA: Diagnosis not present

## 2017-06-21 DIAGNOSIS — Z85828 Personal history of other malignant neoplasm of skin: Secondary | ICD-10-CM | POA: Diagnosis not present

## 2017-06-21 DIAGNOSIS — L821 Other seborrheic keratosis: Secondary | ICD-10-CM | POA: Diagnosis not present

## 2017-06-21 LAB — COMPREHENSIVE METABOLIC PANEL
ALT: 14 U/L (ref 0–35)
AST: 16 U/L (ref 0–37)
Albumin: 3.8 g/dL (ref 3.5–5.2)
Alkaline Phosphatase: 50 U/L (ref 39–117)
BILIRUBIN TOTAL: 0.5 mg/dL (ref 0.2–1.2)
BUN: 36 mg/dL — AB (ref 6–23)
CALCIUM: 10 mg/dL (ref 8.4–10.5)
CHLORIDE: 101 meq/L (ref 96–112)
CO2: 30 meq/L (ref 19–32)
CREATININE: 1.39 mg/dL — AB (ref 0.40–1.20)
GFR: 37.52 mL/min — ABNORMAL LOW (ref >60.00)
GLUCOSE: 75 mg/dL (ref 70–99)
Potassium: 4.7 mEq/L (ref 3.5–5.1)
SODIUM: 138 meq/L (ref 135–145)
Total Protein: 7.8 g/dL (ref 6.0–8.3)

## 2017-06-21 LAB — LIPID PANEL
CHOL/HDL RATIO: 4
Cholesterol: 135 mg/dL (ref 0–200)
HDL: 36.2 mg/dL — ABNORMAL LOW (ref >39.00)
LDL CALC: 64 mg/dL (ref 0–99)
NONHDL: 98.35
Triglycerides: 170 mg/dL — ABNORMAL HIGH (ref 0.0–149.0)
VLDL: 34 mg/dL (ref 0.0–40.0)

## 2017-06-21 LAB — CBC
HCT: 41.1 % (ref 36.0–46.0)
Hemoglobin: 13.9 g/dL (ref 12.0–15.0)
MCHC: 34 g/dL (ref 30.0–36.0)
MCV: 97.1 fl (ref 78.0–100.0)
PLATELETS: 326 10*3/uL (ref 150.0–400.0)
RBC: 4.23 Mil/uL (ref 3.87–5.11)
RDW: 13.3 % (ref 11.5–15.5)
WBC: 10.8 10*3/uL — ABNORMAL HIGH (ref 4.0–10.5)

## 2017-06-21 LAB — TSH: TSH: 78.43 u[IU]/mL — AB (ref 0.35–4.50)

## 2017-06-21 LAB — T4, FREE: FREE T4: 0.53 ng/dL — AB (ref 0.60–1.60)

## 2017-06-21 MED ORDER — LEVOTHYROXINE SODIUM 75 MCG PO TABS: 75.0000 ug | ORAL_TABLET | Freq: Every day | ORAL | 1 refills | Status: DC

## 2017-06-21 MED ORDER — DOXYCYCLINE HYCLATE 100 MG PO TABS: 100.0000 mg | ORAL_TABLET | Freq: Two times a day (BID) | ORAL | 0 refills | Status: DC

## 2017-06-21 NOTE — Progress Notes (Signed)
   Subjective:    Patient ID: Gloria Gonzalez, female    DOB: [birth year], 84 y.o.   MRN: [id number]  HPI The patient is a 84 YO female coming in for follow up of her thyroid (taking synthroid 50 mg daily, denies missing doses, denies taking with other things, does have lack of energy unchanged from prior, denies heat intolerance, is cold a lot) and cholesterol (taking lovaza and atorvastatin, denies side effects, denies chest pains or stroke symptoms) and her osteoporosis (taking evista, denies side effects, denies falls, denies fractures in the last year) as well as new problem of cough (going on for some time, at least 3 weeks, some SOB with activity, denies fevers or chills, denies production, using flonase and allergy medicine).   Review of Systems  Constitutional: Negative.   HENT: Negative.   Eyes: Negative.   Respiratory: Negative for cough, chest tightness and shortness of breath.   Cardiovascular: Negative for chest pain, palpitations and leg swelling.  Gastrointestinal: Negative for abdominal distention, abdominal pain, constipation, diarrhea, nausea and vomiting.  Musculoskeletal: Negative.   Skin: Negative.   Neurological: Negative.   Psychiatric/Behavioral: Negative.       Objective:   Physical Exam  Constitutional: She is oriented to person, place, and time. She appears well-developed and well-nourished.  HENT:  Head: Normocephalic and atraumatic.  Eyes: EOM are normal.  Neck: Normal range of motion.  Cardiovascular: Normal rate and regular rhythm.  Pulmonary/Chest: Effort normal and breath sounds normal. No respiratory distress. She has no wheezes. She has no rales.  Abdominal: Soft. Bowel sounds are normal. She exhibits no distension. There is no tenderness. There is no rebound.  Musculoskeletal: She exhibits no edema.  Neurological: She is alert and oriented to person, place, and time. Coordination normal.  Skin: Skin is warm and dry.  Psychiatric: She has a normal  mood and affect.   Vitals:   06/21/17 0954  BP: (!) 100/50  Pulse: 77  Temp: 97.7 F (36.5 C)  TempSrc: Oral  SpO2: 95%  Weight: 112 lb (50.8 kg)  Height: 4\' 10"  (1.473 m)      Assessment & Plan:

## 2017-06-21 NOTE — Patient Instructions (Signed)
We will check the chest x-ray and the labs today and will call you back about the results.

## 2017-06-24 DIAGNOSIS — R059 Cough, unspecified: Secondary | ICD-10-CM | POA: Insufficient documentation

## 2017-06-24 DIAGNOSIS — R05 Cough: Secondary | ICD-10-CM | POA: Insufficient documentation

## 2017-06-24 NOTE — Assessment & Plan Note (Signed)
Checking TSH and free T4 and adjust as needed synthroid 50 mcg daily.

## 2017-06-24 NOTE — Assessment & Plan Note (Signed)
Checking CXR today and adjust as needed. Refill flonase to help with drainage.

## 2017-06-24 NOTE — Assessment & Plan Note (Signed)
Taking evista and declines bone density at this time due to age.

## 2017-06-24 NOTE — Assessment & Plan Note (Signed)
Checking lipid panel and CMP and adjust lovaza and lipitor as needed.

## 2017-06-25 ENCOUNTER — Telehealth: Payer: Self-pay | Admitting: Internal Medicine

## 2017-06-25 DIAGNOSIS — R0981 Nasal congestion: Secondary | ICD-10-CM

## 2017-06-25 NOTE — Telephone Encounter (Signed)
Daughter informed. Patients daughter is wondering how long these take or if it can be rushed because she does not want mother going to long with these symptoms

## 2017-06-25 NOTE — Telephone Encounter (Signed)
Copied from [place] [id number]. Topic: Referral - Request >> Jun 25, 2017  9:47 AM Ahmed Prima L wrote: Reason for CRM: Pt's daughter is requesting a referral to an ENT because she said that she is still having a dripping nose, cough & sore throat. Call back is [phone] Joycelyn Schmid)

## 2017-06-25 NOTE — Telephone Encounter (Signed)
Referral placed.

## 2017-06-28 NOTE — Telephone Encounter (Signed)
Referral has been sent and pt's daughter informed

## 2017-07-05 DIAGNOSIS — J3081 Allergic rhinitis due to animal (cat) (dog) hair and dander: Secondary | ICD-10-CM | POA: Diagnosis not present

## 2017-07-05 DIAGNOSIS — J301 Allergic rhinitis due to pollen: Secondary | ICD-10-CM | POA: Diagnosis not present

## 2017-07-05 DIAGNOSIS — J342 Deviated nasal septum: Secondary | ICD-10-CM | POA: Diagnosis not present

## 2017-07-05 LAB — CUP PACEART INCLINIC DEVICE CHECK
Date Time Interrogation Session: 20190408093810
Implantable Lead Implant Date: 20031112
Implantable Lead Location: 753859
Implantable Lead Model: 4092
Implantable Lead Model: 4592
MDC IDC LEAD IMPLANT DT: 20031112
MDC IDC LEAD LOCATION: 753860
MDC IDC PG IMPLANT DT: 20180830

## 2017-07-13 DIAGNOSIS — H353131 Nonexudative age-related macular degeneration, bilateral, early dry stage: Secondary | ICD-10-CM | POA: Diagnosis not present

## 2017-07-13 DIAGNOSIS — H01004 Unspecified blepharitis left upper eyelid: Secondary | ICD-10-CM | POA: Diagnosis not present

## 2017-07-13 DIAGNOSIS — H01001 Unspecified blepharitis right upper eyelid: Secondary | ICD-10-CM | POA: Diagnosis not present

## 2017-07-13 DIAGNOSIS — D3142 Benign neoplasm of left ciliary body: Secondary | ICD-10-CM | POA: Diagnosis not present

## 2017-07-13 DIAGNOSIS — H04123 Dry eye syndrome of bilateral lacrimal glands: Secondary | ICD-10-CM | POA: Diagnosis not present

## 2017-07-20 ENCOUNTER — Other Ambulatory Visit: Payer: Self-pay | Admitting: Internal Medicine

## 2017-07-20 DIAGNOSIS — Z950 Presence of cardiac pacemaker: Secondary | ICD-10-CM

## 2017-08-10 ENCOUNTER — Other Ambulatory Visit: Payer: Self-pay | Admitting: Internal Medicine

## 2017-08-10 DIAGNOSIS — M810 Age-related osteoporosis without current pathological fracture: Secondary | ICD-10-CM

## 2017-08-10 DIAGNOSIS — Z950 Presence of cardiac pacemaker: Secondary | ICD-10-CM

## 2017-08-25 ENCOUNTER — Ambulatory Visit (INDEPENDENT_AMBULATORY_CARE_PROVIDER_SITE_OTHER): Payer: Medicare Other | Admitting: *Deleted

## 2017-08-25 DIAGNOSIS — I442 Atrioventricular block, complete: Secondary | ICD-10-CM

## 2017-08-25 NOTE — Progress Notes (Signed)
Remote pacemaker transmission.   

## 2017-08-27 LAB — CUP PACEART REMOTE DEVICE CHECK
Battery Impedance: 100 Ohm
Battery Remaining Longevity: 136 mo
Brady Statistic AP VP Percent: 100 %
Date Time Interrogation Session: 20190528172653
Implantable Lead Implant Date: 20031112
Implantable Lead Location: 753859
Implantable Lead Location: 753860
Implantable Lead Model: 4092
Implantable Pulse Generator Implant Date: 20180830
Lead Channel Setting Pacing Amplitude: 2 V
Lead Channel Setting Pacing Pulse Width: 0.4 ms
Lead Channel Setting Sensing Sensitivity: 4 mV
MDC IDC LEAD IMPLANT DT: 20031112
MDC IDC MSMT BATTERY VOLTAGE: 2.79 V
MDC IDC MSMT LEADCHNL RA IMPEDANCE VALUE: 581 Ohm
MDC IDC MSMT LEADCHNL RV IMPEDANCE VALUE: 927 Ohm
MDC IDC MSMT LEADCHNL RV PACING THRESHOLD AMPLITUDE: 0.375 V
MDC IDC MSMT LEADCHNL RV PACING THRESHOLD PULSEWIDTH: 0.4 ms
MDC IDC SET LEADCHNL RV PACING AMPLITUDE: 2.5 V
MDC IDC STAT BRADY AP VS PERCENT: 0 %
MDC IDC STAT BRADY AS VP PERCENT: 0 %
MDC IDC STAT BRADY AS VS PERCENT: 0 %

## 2017-09-21 ENCOUNTER — Telehealth: Payer: Self-pay | Admitting: Cardiology

## 2017-09-21 NOTE — Telephone Encounter (Signed)
New message    Pt is calling asking for a call back about her pacemaker. She said she is not having a problem just needs some information.

## 2017-09-21 NOTE — Telephone Encounter (Signed)
Patient called to schedule with WC per recall. Scheduled for 8/13 at 230pm. Confirmed patients remote check for 8/28. Patient verbalized understanding.

## 2017-10-04 ENCOUNTER — Ambulatory Visit: Payer: Medicare Other

## 2017-11-09 ENCOUNTER — Ambulatory Visit (INDEPENDENT_AMBULATORY_CARE_PROVIDER_SITE_OTHER): Payer: Medicare Other | Admitting: Cardiology

## 2017-11-09 ENCOUNTER — Encounter: Payer: Self-pay | Admitting: Cardiology

## 2017-11-09 VITALS — BP 106/58 | HR 78 | Ht <= 58 in | Wt 107.8 lb

## 2017-11-09 DIAGNOSIS — E785 Hyperlipidemia, unspecified: Secondary | ICD-10-CM | POA: Diagnosis not present

## 2017-11-09 DIAGNOSIS — I442 Atrioventricular block, complete: Secondary | ICD-10-CM | POA: Diagnosis not present

## 2017-11-09 LAB — CUP PACEART INCLINIC DEVICE CHECK
Battery Impedance: 100 Ohm
Brady Statistic AP VS Percent: 0 %
Brady Statistic AS VS Percent: 0 %
Date Time Interrogation Session: 20190813164437
Implantable Lead Implant Date: 20031112
Implantable Lead Location: 753859
Implantable Lead Location: 753860
Implantable Lead Model: 4092
Lead Channel Pacing Threshold Amplitude: 0.375 V
Lead Channel Pacing Threshold Amplitude: 0.5 V
Lead Channel Pacing Threshold Pulse Width: 0.27 ms
Lead Channel Pacing Threshold Pulse Width: 0.4 ms
Lead Channel Pacing Threshold Pulse Width: 0.4 ms
Lead Channel Setting Pacing Amplitude: 2 V
Lead Channel Setting Pacing Amplitude: 2.5 V
Lead Channel Setting Pacing Pulse Width: 0.4 ms
Lead Channel Setting Sensing Sensitivity: 4 mV
MDC IDC LEAD IMPLANT DT: 20031112
MDC IDC MSMT BATTERY REMAINING LONGEVITY: 136 mo
MDC IDC MSMT BATTERY VOLTAGE: 2.78 V
MDC IDC MSMT LEADCHNL RA IMPEDANCE VALUE: 630 Ohm
MDC IDC MSMT LEADCHNL RA PACING THRESHOLD AMPLITUDE: 0.75 V
MDC IDC MSMT LEADCHNL RV IMPEDANCE VALUE: 869 Ohm
MDC IDC MSMT LEADCHNL RV SENSING INTR AMPL: 11.2 mV
MDC IDC PG IMPLANT DT: 20180830
MDC IDC STAT BRADY AP VP PERCENT: 100 %
MDC IDC STAT BRADY AS VP PERCENT: 0 %

## 2017-11-09 NOTE — Progress Notes (Signed)
Electrophysiology Office Note   Date:  11/09/2017   ID:  Gloria Gonzalez, DOB January 10, 1922, MRN [id number]  PCP:  Hoyt Koch, MD   Primary Electrophysiologist:  Constance Haw, MD    No chief complaint on file.    History of Present Illness: Gloria Gonzalez is a 84 y.o. female who presents today for electrophysiology evaluation.   She has a pacemaker which was placed in 2003.  The device was implanted for an irregular rhythm, likely some sort of heart block.  She had a [employer] generator change on 11/26/16.   Today, denies symptoms of palpitations, chest pain, shortness of breath, orthopnea, PND, lower extremity edema, claudication, dizziness, presyncope, syncope, bleeding, or neurologic sequela. The patient is tolerating medications without difficulties.  Her main complaint today is of weakness and fatigue.  Her Medtronic pacemaker is set to VVIR due to a recall.  She is interested in a generator change today.   Past Medical History:  Diagnosis Date  . Arrhythmia   . Arthritis   . Complete heart block ([place])   . Glaucoma   . Hyperlipidemia   . Thyroid disease    Past Surgical History:  Procedure Laterality Date  . EYE SURGERY    . PACEMAKER INSERTION  2003  . PPM GENERATOR CHANGEOUT N/A 11/26/2016   Procedure: PPM GENERATOR CHANGEOUT;  Surgeon: Constance Haw, MD;  Location: [facility];  Service: Cardiovascular;  Laterality: N/A;     Current Outpatient Medications  Medication Sig Dispense Refill  . atorvastatin (LIPITOR) 20 MG tablet TAKE 1 TABLET BY MOUTH DAILY AT 6 PM 90 tablet 3  . Calcium Citrate-Vitamin D (CALCIUM + D PO) Take 1 tablet by mouth 2 (two) times daily.     . Eyelid Cleansers (OCUSOFT BABY EYELID & EYELASH) PADS Apply 1 each to eye 2 (two) times daily.     . fluticasone (FLONASE) 50 MCG/ACT nasal spray Place 1 spray into both nostrils daily. 16 g 1  . Glucosamine-Chondroitin (COSAMIN DS PO) Take 1 tablet by mouth 2 (two)  times daily.    Marland Kitchen levothyroxine (SYNTHROID, LEVOTHROID) 75 MCG tablet Take 1 tablet (75 mcg total) by mouth daily. 90 tablet 1  . Multiple Vitamins-Minerals (PRESERVISION AREDS 2) CAPS Take 1 capsule by mouth 2 (two) times daily.    Marland Kitchen omega-3 acid ethyl esters (LOVAZA) 1 g capsule TAKE 1 CAPSULE BY MOUTH TWICE DAILY 180 capsule 3  . raloxifene (EVISTA) 60 MG tablet TAKE 1 TABLET BY MOUTH DAILY 90 tablet 3   No current facility-administered medications for this visit.     Allergies:   Patient has no known allergies.   Social History:  The patient  reports that she has never smoked. She has never used smokeless tobacco. She reports that she does not drink alcohol or use drugs.   Family History:  [name] family history includes Arthritis in her mother; Heart disease in her brother and sister.   ROS:  Please see the history of present illness.   Otherwise, review of systems is positive for weakness, fatigue.   All other systems are reviewed and negative.   PHYSICAL EXAM: VS:  BP (!) 106/58   Pulse 78   Ht 4\' 10"  (1.473 m)   Wt 107 lb 12.8 oz (48.9 kg)   SpO2 93%   BMI 22.53 kg/m  , BMI Body mass index is 22.53 kg/m. GEN: Well nourished, well developed, in no acute distress  HEENT: normal  Neck: no  JVD, carotid bruits, or masses Cardiac: RRR; no murmurs, rubs, or gallops,no edema  Respiratory:  clear to auscultation bilaterally, normal work of breathing GI: soft, nontender, nondistended, + BS MS: no deformity or atrophy  Skin: warm and dry, device site well healed Neuro:  Strength and sensation are intact Psych: euthymic mood, full affect  EKG:  EKG is not ordered today. Personal review of the ekg ordered 06/17/17 shows AV paced  Personal review of the device interrogation today. Results in [facility]: 06/21/2017: ALT 14; BUN 36; Creatinine, Ser 1.39; Hemoglobin 13.9; Platelets 326.0; Potassium 4.7; Sodium 138; TSH 78.43    Lipid Panel     Component Value  Date/Time   CHOL 135 06/21/2017 1050   TRIG 170.0 (H) 06/21/2017 1050   HDL 36.20 (L) 06/21/2017 1050   CHOLHDL 4 06/21/2017 1050   VLDL 34.0 06/21/2017 1050   LDLCALC 64 06/21/2017 1050   LDLDIRECT 61.0 06/23/2016 1321     Wt Readings from Last 3 Encounters:  11/09/17 107 lb 12.8 oz (48.9 kg)  06/21/17 112 lb (50.8 kg)  06/17/17 113 lb (51.3 kg)   ASSESSMENT AND PLAN:  1.  Complete heart block: Dual-chamber generator change 11/26/2016.  There was a recall on her device and she was thus set to DVI.  She is having weakness and fatigue that is likely due to her device settings.  At this point, she is ready for a generator change.  I Jermale Crass discuss this with [employer] as to getting the device compensated.  2. Hyperlipidemia: Continue atorvastatin  Current medicines are reviewed at length with the patient today.   The patient does not have concerns regarding her medicines.  The following changes were made today: None  Labs/ tests ordered today include: none  No orders of the defined types were placed in this encounter.    Disposition:   FU with Lavanna Rog 6 months  Signed, Liani Caris Meredith Leeds, MD  11/09/2017 2:53 PM     [facility] [address] [place] [address] [phone] (office) [phone] (fax)

## 2017-11-09 NOTE — Patient Instructions (Signed)
Medication Instructions:  Your physician recommends that you continue on your current medications as directed. Please refer to the Current Medication list given to you today.  *If you need a refill on your cardiac medications before your next appointment, please call your pharmacy*  Labwork: None ordered  Testing/Procedures: Your physician has recommended that you have a pacemaker generator (battery) change.  We will discuss the need for battery change with [employer].  We will call you once discussed about your recalled pacemaker.   Follow-Up: Remote monitoring is used to monitor your Pacemaker or ICD from home. This monitoring reduces the number of office visits required to check your device to one time per year. It allows [place] to keep an eye on the functioning of your device to ensure it is working properly. You are scheduled for a device check from home on 11/24/2017. You may send your transmission at any time that day. If you have a wireless device, the transmission will be sent automatically. After your physician reviews your transmission, you will receive a postcard with your next transmission date.  Your physician wants you to follow-up in: 6 months with Dr. Curt Bears.  You will receive a reminder letter in the mail two months in advance. If you don't receive a letter, please call our office to schedule the follow-up appointment.  Thank you for choosing [facility]!!   Trinidad Curet, RN [phone]  Any Other Special Instructions Will Be Listed Below (If Applicable).

## 2017-11-11 ENCOUNTER — Telehealth: Payer: Self-pay | Admitting: *Deleted

## 2017-11-11 NOTE — Telephone Encounter (Signed)
Scheduled generator change for 8/19. Instructions reviewed via telephone to pt and dtr. Post procedure follow up appts made Pt & dtr verbalized understanding and agreeable to plan.

## 2017-11-15 ENCOUNTER — Encounter (HOSPITAL_COMMUNITY): Payer: Self-pay | Admitting: Cardiology

## 2017-11-15 ENCOUNTER — Encounter (HOSPITAL_COMMUNITY)
Admission: RE | Disposition: A | Discharge status: Home / Self Care | Payer: Self-pay | Source: Ambulatory Visit | Attending: Cardiology

## 2017-11-15 ENCOUNTER — Ambulatory Visit (HOSPITAL_COMMUNITY)
Admission: RE | Admit: 2017-11-15 | Discharge: 2017-11-15 | Disposition: A | Discharge status: Home / Self Care | Payer: Medicare Other | Source: Ambulatory Visit | Attending: Cardiology | Admitting: Cardiology

## 2017-11-15 DIAGNOSIS — Z9889 Other specified postprocedural states: Secondary | ICD-10-CM | POA: Diagnosis not present

## 2017-11-15 DIAGNOSIS — Z45010 Encounter for checking and testing of cardiac pacemaker pulse generator [battery]: Secondary | ICD-10-CM | POA: Diagnosis not present

## 2017-11-15 DIAGNOSIS — M1990 Unspecified osteoarthritis, unspecified site: Secondary | ICD-10-CM | POA: Insufficient documentation

## 2017-11-15 DIAGNOSIS — E079 Disorder of thyroid, unspecified: Secondary | ICD-10-CM | POA: Insufficient documentation

## 2017-11-15 DIAGNOSIS — R001 Bradycardia, unspecified: Secondary | ICD-10-CM | POA: Diagnosis present

## 2017-11-15 DIAGNOSIS — E785 Hyperlipidemia, unspecified: Secondary | ICD-10-CM | POA: Insufficient documentation

## 2017-11-15 DIAGNOSIS — Z8249 Family history of ischemic heart disease and other diseases of the circulatory system: Secondary | ICD-10-CM | POA: Insufficient documentation

## 2017-11-15 DIAGNOSIS — Z7951 Long term (current) use of inhaled steroids: Secondary | ICD-10-CM | POA: Insufficient documentation

## 2017-11-15 DIAGNOSIS — Z79899 Other long term (current) drug therapy: Secondary | ICD-10-CM | POA: Diagnosis not present

## 2017-11-15 DIAGNOSIS — I442 Atrioventricular block, complete: Secondary | ICD-10-CM | POA: Insufficient documentation

## 2017-11-15 DIAGNOSIS — Z8261 Family history of arthritis: Secondary | ICD-10-CM | POA: Insufficient documentation

## 2017-11-15 DIAGNOSIS — H409 Unspecified glaucoma: Secondary | ICD-10-CM | POA: Diagnosis not present

## 2017-11-15 DIAGNOSIS — Z79890 Hormone replacement therapy (postmenopausal): Secondary | ICD-10-CM | POA: Insufficient documentation

## 2017-11-15 HISTORY — PX: PPM GENERATOR CHANGEOUT: EP1233

## 2017-11-15 LAB — CBC
HCT: 41 % (ref 36.0–46.0)
HEMOGLOBIN: 13.2 g/dL (ref 12.0–15.0)
MCH: 32.4 pg (ref 26.0–34.0)
MCHC: 32.2 g/dL (ref 30.0–36.0)
MCV: 100.7 fL — ABNORMAL HIGH (ref 78.0–100.0)
Platelets: 255 10*3/uL (ref 150–400)
RBC: 4.07 MIL/uL (ref 3.87–5.11)
RDW: 12.9 % (ref 11.5–15.5)
WBC: 9.7 10*3/uL (ref 4.0–10.5)

## 2017-11-15 LAB — GLUCOSE, CAPILLARY: Glucose-Capillary: 104 mg/dL — ABNORMAL HIGH (ref 70–99)

## 2017-11-15 LAB — BASIC METABOLIC PANEL
Anion gap: 9 (ref 5–15)
BUN: 38 mg/dL — AB (ref 8–23)
CHLORIDE: 106 mmol/L (ref 98–111)
CO2: 29 mmol/L (ref 22–32)
Calcium: 9.4 mg/dL (ref 8.9–10.3)
Creatinine, Ser: 1.31 mg/dL — ABNORMAL HIGH (ref 0.44–1.00)
GFR calc Af Amer: 39 mL/min — ABNORMAL LOW (ref >60)
GFR calc non Af Amer: 34 mL/min — ABNORMAL LOW (ref >60)
GLUCOSE: 103 mg/dL — AB (ref 70–99)
POTASSIUM: 4.2 mmol/L (ref 3.5–5.1)
Sodium: 144 mmol/L (ref 135–145)

## 2017-11-15 LAB — SURGICAL PCR SCREEN
MRSA, PCR: NEGATIVE
Staphylococcus aureus: NEGATIVE

## 2017-11-15 SURGERY — PPM GENERATOR CHANGEOUT
Anesthesia: LOCAL

## 2017-11-15 MED ORDER — MUPIROCIN 2 % EX OINT
1.0000 "application " | TOPICAL_OINTMENT | Freq: Once | CUTANEOUS | Status: AC
  Administered 2017-11-15: 1 via TOPICAL
  Filled 2017-11-15 (×2): qty 22

## 2017-11-15 MED ORDER — SODIUM CHLORIDE 0.9 % IV SOLN
INTRAVENOUS | Status: AC
  Filled 2017-11-15: qty 2

## 2017-11-15 MED ORDER — SODIUM CHLORIDE 0.9 % IV SOLN
INTRAVENOUS | Status: DC
  Administered 2017-11-15: 08:00:00 via INTRAVENOUS

## 2017-11-15 MED ORDER — LIDOCAINE HCL (PF) 1 % IJ SOLN
INTRAMUSCULAR | Status: DC | PRN
  Administered 2017-11-15: 60 mL

## 2017-11-15 MED ORDER — SODIUM CHLORIDE 0.9 % IV SOLN
80.0000 mg | INTRAVENOUS | Status: AC
  Administered 2017-11-15: 80 mg

## 2017-11-15 MED ORDER — LIDOCAINE HCL (PF) 1 % IJ SOLN
INTRAMUSCULAR | Status: AC
  Filled 2017-11-15: qty 60

## 2017-11-15 MED ORDER — ONDANSETRON HCL 4 MG/2ML IJ SOLN: 4.0000 mg | Freq: Four times a day (QID) | INTRAMUSCULAR | Status: DC | PRN

## 2017-11-15 MED ORDER — ACETAMINOPHEN 325 MG PO TABS: 325.0000 mg | ORAL_TABLET | ORAL | Status: DC | PRN

## 2017-11-15 MED ORDER — CEFAZOLIN SODIUM-DEXTROSE 2-4 GM/100ML-% IV SOLN
INTRAVENOUS | Status: AC
  Filled 2017-11-15: qty 100

## 2017-11-15 MED ORDER — CEFAZOLIN SODIUM-DEXTROSE 2-4 GM/100ML-% IV SOLN
2.0000 g | INTRAVENOUS | Status: AC
  Administered 2017-11-15: 2 g via INTRAVENOUS

## 2017-11-15 SURGICAL SUPPLY — 5 items
CABLE SURGICAL S-101-97-12 (CABLE) ×2 IMPLANT
PACEMAKER ADAPTA DR ADDRL1 (Pacemaker) ×1 IMPLANT
PAD DEFIB LIFELINK (PAD) ×2 IMPLANT
PPM ADAPTA DR ADDRL1 (Pacemaker) ×2 IMPLANT
TRAY PACEMAKER INSERTION (PACKS) ×2 IMPLANT

## 2017-11-15 NOTE — Discharge Instructions (Signed)

## 2017-11-15 NOTE — H&P (Signed)
Gloria Gonzalez has presented today for surgery, with the diagnosis of complete AV block.  The various methods of treatment have been discussed with the patient and family. After consideration of risks, benefits and other options for treatment, the patient has consented to  Procedure(s): Pacemaker generator change as a surgical intervention .  Risks include but not limited to bleeding, tamponade, infection, pneumothorax, among others. [name] history has been reviewed, patient examined, no change in status, stable for surgery.  I have reviewed the patient's chart and labs.  Questions were answered to the patient's satisfaction.    Skylynn Burkley Curt Bears, MD 11/15/2017 8:11 AM

## 2017-11-15 NOTE — Progress Notes (Signed)
Pt complaining of "whooziness and a spinning feeling". CBG checked, was 104. RN paged MD to come assess pt prior to D/C.

## 2017-11-24 ENCOUNTER — Ambulatory Visit (INDEPENDENT_AMBULATORY_CARE_PROVIDER_SITE_OTHER): Payer: Medicare Other | Admitting: *Deleted

## 2017-11-24 ENCOUNTER — Telehealth: Payer: Self-pay

## 2017-11-24 DIAGNOSIS — I442 Atrioventricular block, complete: Secondary | ICD-10-CM | POA: Diagnosis not present

## 2017-11-24 NOTE — Telephone Encounter (Signed)
Spoke with pt and reminded pt of remote transmission that is due today. Pt verbalized understanding.   

## 2017-11-25 ENCOUNTER — Ambulatory Visit (INDEPENDENT_AMBULATORY_CARE_PROVIDER_SITE_OTHER): Payer: Medicare Other | Admitting: *Deleted

## 2017-11-25 DIAGNOSIS — I442 Atrioventricular block, complete: Secondary | ICD-10-CM

## 2017-11-25 LAB — CUP PACEART INCLINIC DEVICE CHECK
Battery Impedance: 100 Ohm
Battery Remaining Longevity: 161 mo
Battery Voltage: 2.8 V
Brady Statistic AP VS Percent: 0 %
Implantable Lead Implant Date: 20031112
Implantable Lead Location: 753859
Implantable Lead Model: 4092
Implantable Lead Model: 4592
Implantable Pulse Generator Implant Date: 20190819
Lead Channel Impedance Value: 539 Ohm
Lead Channel Pacing Threshold Amplitude: 0.25 V
Lead Channel Pacing Threshold Amplitude: 0.5 V
Lead Channel Pacing Threshold Amplitude: 0.5 V
Lead Channel Pacing Threshold Pulse Width: 0.4 ms
Lead Channel Sensing Intrinsic Amplitude: 2 mV
Lead Channel Setting Pacing Amplitude: 1.5 V
Lead Channel Setting Pacing Pulse Width: 0.4 ms
Lead Channel Setting Sensing Sensitivity: 5.6 mV
MDC IDC LEAD IMPLANT DT: 20031112
MDC IDC LEAD LOCATION: 753860
MDC IDC MSMT LEADCHNL RA PACING THRESHOLD PULSEWIDTH: 0.4 ms
MDC IDC MSMT LEADCHNL RV IMPEDANCE VALUE: 913 Ohm
MDC IDC MSMT LEADCHNL RV PACING THRESHOLD PULSEWIDTH: 0.4 ms
MDC IDC SESS DTM: 20190829180430
MDC IDC SET LEADCHNL RV PACING AMPLITUDE: 2 V
MDC IDC STAT BRADY AP VP PERCENT: 1 %
MDC IDC STAT BRADY AS VP PERCENT: 99 %
MDC IDC STAT BRADY AS VS PERCENT: 0 %

## 2017-11-25 NOTE — Progress Notes (Signed)
Wound check appointment. Steri-strips removed. Wound without redness or edema. Incision edges approximated, wound well healed. Normal device function. Thresholds, sensing, and impedances consistent with implant measurements. Device programmed at chronic outputs s/p gen change. Histogram distribution appropriate for patient and level of activity. 8 mode switches (<0.1), no available EGMs, all episodes <22minute. No high ventricular rates noted. Patient educated about wound care, arm mobility, lifting restrictions. ROV with WC 03/01/18

## 2017-11-25 NOTE — Progress Notes (Signed)
Remote pacemaker transmission.   

## 2017-12-14 LAB — CUP PACEART REMOTE DEVICE CHECK
Battery Impedance: 100 Ohm
Battery Remaining Longevity: 160 mo
Battery Voltage: 2.8 V
Brady Statistic AP VS Percent: 0 %
Brady Statistic AS VP Percent: 99 %
Date Time Interrogation Session: 20190828193758
Implantable Lead Implant Date: 20031112
Implantable Lead Location: 753859
Implantable Lead Location: 753860
Implantable Lead Model: 4092
Implantable Lead Model: 4592
Implantable Pulse Generator Implant Date: 20190819
Lead Channel Impedance Value: 558 Ohm
Lead Channel Pacing Threshold Amplitude: 0.5 V
Lead Channel Pacing Threshold Pulse Width: 0.4 ms
Lead Channel Pacing Threshold Pulse Width: 0.4 ms
Lead Channel Setting Pacing Amplitude: 1.5 V
Lead Channel Setting Pacing Amplitude: 2 V
Lead Channel Setting Pacing Pulse Width: 0.4 ms
MDC IDC LEAD IMPLANT DT: 20031112
MDC IDC MSMT LEADCHNL RV IMPEDANCE VALUE: 861 Ohm
MDC IDC MSMT LEADCHNL RV PACING THRESHOLD AMPLITUDE: 0.375 V
MDC IDC SET LEADCHNL RV SENSING SENSITIVITY: 5.6 mV
MDC IDC STAT BRADY AP VP PERCENT: 1 %
MDC IDC STAT BRADY AS VS PERCENT: 0 %

## 2018-01-14 DIAGNOSIS — H524 Presbyopia: Secondary | ICD-10-CM | POA: Diagnosis not present

## 2018-01-14 DIAGNOSIS — H5201 Hypermetropia, right eye: Secondary | ICD-10-CM | POA: Diagnosis not present

## 2018-01-14 DIAGNOSIS — H5202 Hypermetropia, left eye: Secondary | ICD-10-CM | POA: Diagnosis not present

## 2018-01-14 DIAGNOSIS — H01001 Unspecified blepharitis right upper eyelid: Secondary | ICD-10-CM | POA: Diagnosis not present

## 2018-01-14 DIAGNOSIS — D3142 Benign neoplasm of left ciliary body: Secondary | ICD-10-CM | POA: Diagnosis not present

## 2018-01-14 DIAGNOSIS — H01004 Unspecified blepharitis left upper eyelid: Secondary | ICD-10-CM | POA: Diagnosis not present

## 2018-01-14 DIAGNOSIS — H52221 Regular astigmatism, right eye: Secondary | ICD-10-CM | POA: Diagnosis not present

## 2018-01-14 DIAGNOSIS — H353131 Nonexudative age-related macular degeneration, bilateral, early dry stage: Secondary | ICD-10-CM | POA: Diagnosis not present

## 2018-01-18 DIAGNOSIS — L820 Inflamed seborrheic keratosis: Secondary | ICD-10-CM | POA: Diagnosis not present

## 2018-01-18 DIAGNOSIS — D1801 Hemangioma of skin and subcutaneous tissue: Secondary | ICD-10-CM | POA: Diagnosis not present

## 2018-01-18 DIAGNOSIS — L821 Other seborrheic keratosis: Secondary | ICD-10-CM | POA: Diagnosis not present

## 2018-01-18 DIAGNOSIS — D692 Other nonthrombocytopenic purpura: Secondary | ICD-10-CM | POA: Diagnosis not present

## 2018-01-18 DIAGNOSIS — M6740 Ganglion, unspecified site: Secondary | ICD-10-CM | POA: Diagnosis not present

## 2018-01-18 DIAGNOSIS — Z23 Encounter for immunization: Secondary | ICD-10-CM | POA: Diagnosis not present

## 2018-01-18 DIAGNOSIS — Z85828 Personal history of other malignant neoplasm of skin: Secondary | ICD-10-CM | POA: Diagnosis not present

## 2018-01-18 DIAGNOSIS — L814 Other melanin hyperpigmentation: Secondary | ICD-10-CM | POA: Diagnosis not present

## 2018-01-18 DIAGNOSIS — L309 Dermatitis, unspecified: Secondary | ICD-10-CM | POA: Diagnosis not present

## 2018-01-27 DIAGNOSIS — L821 Other seborrheic keratosis: Secondary | ICD-10-CM | POA: Diagnosis not present

## 2018-01-27 DIAGNOSIS — D485 Neoplasm of uncertain behavior of skin: Secondary | ICD-10-CM | POA: Diagnosis not present

## 2018-01-27 DIAGNOSIS — S41112A Laceration without foreign body of left upper arm, initial encounter: Secondary | ICD-10-CM | POA: Diagnosis not present

## 2018-01-27 DIAGNOSIS — L570 Actinic keratosis: Secondary | ICD-10-CM | POA: Diagnosis not present

## 2018-01-27 DIAGNOSIS — D692 Other nonthrombocytopenic purpura: Secondary | ICD-10-CM | POA: Diagnosis not present

## 2018-02-01 ENCOUNTER — Other Ambulatory Visit (INDEPENDENT_AMBULATORY_CARE_PROVIDER_SITE_OTHER): Payer: Medicare Other

## 2018-02-01 ENCOUNTER — Ambulatory Visit (INDEPENDENT_AMBULATORY_CARE_PROVIDER_SITE_OTHER): Payer: Medicare Other | Admitting: Internal Medicine

## 2018-02-01 ENCOUNTER — Encounter: Payer: Self-pay | Admitting: Internal Medicine

## 2018-02-01 VITALS — BP 110/58 | HR 84 | Temp 98.0°F | Ht <= 58 in | Wt 109.0 lb

## 2018-02-01 DIAGNOSIS — G8929 Other chronic pain: Secondary | ICD-10-CM | POA: Diagnosis not present

## 2018-02-01 DIAGNOSIS — E039 Hypothyroidism, unspecified: Secondary | ICD-10-CM | POA: Diagnosis not present

## 2018-02-01 DIAGNOSIS — M25562 Pain in left knee: Secondary | ICD-10-CM | POA: Diagnosis not present

## 2018-02-01 DIAGNOSIS — Z23 Encounter for immunization: Secondary | ICD-10-CM | POA: Diagnosis not present

## 2018-02-01 DIAGNOSIS — M25561 Pain in right knee: Secondary | ICD-10-CM | POA: Diagnosis not present

## 2018-02-01 LAB — TSH: TSH: 10.6 u[IU]/mL — AB (ref 0.35–4.50)

## 2018-02-01 LAB — T4, FREE: FREE T4: 1.06 ng/dL (ref 0.60–1.60)

## 2018-02-01 MED ORDER — DICLOFENAC SODIUM 1 % TD GEL: 2.0000 g | Freq: Four times a day (QID) | TRANSDERMAL | 6 refills | Status: DC

## 2018-02-01 NOTE — Progress Notes (Signed)
   Subjective:    Patient ID: Gloria Gonzalez, female    DOB: 12-14-1921, 84 y.o.   MRN: [id number]  HPI The patient is a 84 YO female coming in for flare of severe thyroid problem (last TSH 78). She did increase dose of medication as recommended about 7 months ago but did not return for follow up labs. She was getting new pacemaker and forgot about this. Denies change in weight. Does have good energy level. Denies taking biotin. Is taking thyroid medicine daily although sometimes with meal and sometimes before breakfast. Denies heat or cold intolerance.  She is also having ongoing bilateral knee pain. She is using tylenol rarely over the counter but this does not help much. She denies recent falls or injury. She does have the left one give out sometimes. She is still able to be active. Denies swelling or redness in the knees. Has not tried any otc medications for it other than tylenol.   Review of Systems  Constitutional: Negative.   HENT: Negative.   Eyes: Negative.   Respiratory: Negative for cough, chest tightness and shortness of breath.   Cardiovascular: Negative for chest pain, palpitations and leg swelling.  Gastrointestinal: Negative for abdominal distention, abdominal pain, constipation, diarrhea, nausea and vomiting.  Musculoskeletal: Positive for arthralgias and myalgias. Negative for back pain, gait problem, joint swelling, neck pain and neck stiffness.  Skin: Negative.   Neurological: Negative.   Psychiatric/Behavioral: Negative.       Objective:   Physical Exam  Constitutional: She is oriented to person, place, and time. She appears well-developed and well-nourished.  HENT:  Head: Normocephalic and atraumatic.  Eyes: EOM are normal.  Neck: Normal range of motion.  Cardiovascular: Normal rate and regular rhythm.  Pulmonary/Chest: Effort normal and breath sounds normal. No respiratory distress. She has no wheezes. She has no rales.  Abdominal: Soft. Bowel sounds are normal.  She exhibits no distension. There is no tenderness. There is no rebound.  Musculoskeletal: She exhibits tenderness. She exhibits no edema.  Neurological: She is alert and oriented to person, place, and time. Coordination normal.  Skin: Skin is warm and dry.  Psychiatric: She has a normal mood and affect.   Vitals:   02/01/18 1449  BP: (!) 110/58  Pulse: 84  Temp: 98 F (36.7 C)  TempSrc: Oral  SpO2: 96%  Weight: 109 lb (49.4 kg)  Height: 4\' 10"  (1.473 m)      Assessment & Plan:  Flu shot given at visit

## 2018-02-01 NOTE — Patient Instructions (Signed)
We are checking the labs for the thyroid.   We have sent in voltaren to use for pain on the knees up to 4 times per day and will get you in with the orthopedic specialist.

## 2018-02-02 DIAGNOSIS — M25562 Pain in left knee: Secondary | ICD-10-CM

## 2018-02-02 DIAGNOSIS — G8929 Other chronic pain: Secondary | ICD-10-CM | POA: Insufficient documentation

## 2018-02-02 DIAGNOSIS — M25561 Pain in right knee: Secondary | ICD-10-CM

## 2018-02-02 NOTE — Assessment & Plan Note (Signed)
Recent TSH was severely abnormal at 78 and increased synthroid to 75 mcg daily. She has been taking this but did not come back for labs. Checking TSH and free T4 today and adjust as needed.

## 2018-02-02 NOTE — Assessment & Plan Note (Signed)
Rx for voltaren and referral to orthopedics and talked to her about injections to help pain. Suspect arthritis as cause.

## 2018-02-08 DIAGNOSIS — L570 Actinic keratosis: Secondary | ICD-10-CM | POA: Diagnosis not present

## 2018-02-15 ENCOUNTER — Ambulatory Visit (INDEPENDENT_AMBULATORY_CARE_PROVIDER_SITE_OTHER): Payer: Medicare Other | Admitting: Family Medicine

## 2018-02-15 ENCOUNTER — Encounter (INDEPENDENT_AMBULATORY_CARE_PROVIDER_SITE_OTHER): Payer: Self-pay | Admitting: Family Medicine

## 2018-02-15 DIAGNOSIS — M25561 Pain in right knee: Secondary | ICD-10-CM | POA: Diagnosis not present

## 2018-02-15 DIAGNOSIS — M25562 Pain in left knee: Secondary | ICD-10-CM

## 2018-02-15 NOTE — Progress Notes (Signed)
Office Visit Note   Patient: Lenoria Narine           Date of Birth: 01-15-1922           MRN: [id number] Visit Date: 02/15/2018 Requested by: Hoyt Koch, MD [place], [place] [id number] PCP: Hoyt Koch, MD  Subjective: Chief Complaint  Patient presents with  . bil knee pain, right greater than left    HPI: She is a 84 year old with bilateral knee pain.  History of osteoarthritis, they have been bothering her for about 9 or 10 years.  She uses a wheelchair much of the time but she gets up to walk every day with help of a personal trainer.  Her knees hurt primarily when standing.  She takes glucosamine over-the-counter with some improvement.  She uses Voltaren gel with some relief.  She has had x-rays in the past.              ROS: She has some cardiac issues but overall is in pretty good health for her age.  She has a strong family history of longevity.  Objective: Vital Signs: There were no vitals taken for this visit.  Physical Exam:  Knees: Varus deformity of both, with 1+ laxity on valgus stress but still a solid endpoint in each knee.  Both knees are tender on the medial joint line.  No significant effusion today.  2+ patellofemoral crepitus in both knees.  Imaging: None today.  Assessment & Plan: 1.  Chronic bilateral knee pain, most likely due to medial greater than lateral compartment DJD -We discussed various options including over-the-counter remedies, cortisone injection, Visco supplementation, medial compartment unloading braces, etc.  She wants to think about these options and let me know if she chooses to proceed.  I will see her back as needed.   Follow-Up Instructions: No follow-ups on file.      Procedures: No procedures performed  No notes on file    PMFS History: Patient Active Problem List   Diagnosis Date Noted  . Chronic pain of both knees 02/02/2018  . Cough 06/24/2017  . Acute bilateral low back pain with  right-sided sciatica 07/14/2016  . Primary osteoarthritis involving multiple joints 12/26/2015  . Hyperlipidemia 06/21/2015  . Pacemaker 12/22/2014  . Hypothyroidism 12/22/2014  . Glaucoma 12/22/2014  . Osteoporosis 12/22/2014   Past Medical History:  Diagnosis Date  . Arrhythmia   . Arthritis   . Complete heart block ([place])   . Glaucoma   . Hyperlipidemia   . Thyroid disease     Family History  Problem Relation Age of Onset  . Arthritis Mother   . Heart disease Sister   . Heart disease Brother     Past Surgical History:  Procedure Laterality Date  . EYE SURGERY    . PACEMAKER INSERTION  2003  . PPM GENERATOR CHANGEOUT N/A 11/26/2016   Procedure: PPM GENERATOR CHANGEOUT;  Surgeon: Constance Haw, MD;  Location: [place] [facility];  Service: Cardiovascular;  Laterality: N/A;  . PPM GENERATOR CHANGEOUT N/A 11/15/2017   Procedure: PPM GENERATOR CHANGEOUT;  Surgeon: Constance Haw, MD;  Location: [facility];  Service: Cardiovascular;  Laterality: N/A;   Social History   Occupational History  . Not on file  Tobacco Use  . Smoking status: Never Smoker  . Smokeless tobacco: Never Used  Substance and Sexual Activity  . Alcohol use: No    Alcohol/week: 0.0 standard drinks  . Drug use: No  .  Sexual activity: Not on file

## 2018-02-15 NOTE — Patient Instructions (Signed)
   Treatment options:  - Glucosamine Sulfate 1,000 mg twice daily  - Turmeric 500 mg twice daily  - Bio-Freeze  - Cortisone injection  - Hyaluronic acid injection  - Medial compartment unloading braces (call me to order)

## 2018-02-23 ENCOUNTER — Ambulatory Visit (INDEPENDENT_AMBULATORY_CARE_PROVIDER_SITE_OTHER): Payer: Medicare Other

## 2018-02-23 DIAGNOSIS — I442 Atrioventricular block, complete: Secondary | ICD-10-CM | POA: Diagnosis not present

## 2018-02-23 NOTE — Progress Notes (Signed)
Remote pacemaker transmission.   

## 2018-03-01 ENCOUNTER — Encounter: Payer: Self-pay | Admitting: Cardiology

## 2018-03-01 ENCOUNTER — Ambulatory Visit (INDEPENDENT_AMBULATORY_CARE_PROVIDER_SITE_OTHER): Payer: Medicare Other | Admitting: Cardiology

## 2018-03-01 VITALS — BP 104/52 | HR 82 | Ht <= 58 in | Wt 104.0 lb

## 2018-03-01 DIAGNOSIS — E785 Hyperlipidemia, unspecified: Secondary | ICD-10-CM

## 2018-03-01 DIAGNOSIS — I442 Atrioventricular block, complete: Secondary | ICD-10-CM

## 2018-03-01 NOTE — Progress Notes (Signed)
Electrophysiology Office Note   Date:  03/01/2018   ID:  Malaysia Crance, DOB 04/07/1921, MRN [id number]  PCP:  Hoyt Koch, MD   Primary Electrophysiologist:  Constance Haw, MD    No chief complaint on file.    History of Present Illness: Gloria Gonzalez is a 84 y.o. female who presents today for electrophysiology evaluation.   She has a pacemaker which was placed in 2003.  The device was implanted for an irregular rhythm, likely some sort of heart block.  She had a Medtronic generator change on 11/26/16.  She had a device that was under advisory and thus had a generator change 11/15/2017.   Today, denies symptoms of palpitations, chest pain, shortness of breath, orthopnea, PND, lower extremity edema, claudication, dizziness, presyncope, syncope, bleeding, or neurologic sequela. The patient is tolerating medications without difficulties.     Past Medical History:  Diagnosis Date  . Arrhythmia   . Arthritis   . Complete heart block ([place])   . Glaucoma   . Hyperlipidemia   . Thyroid disease    Past Surgical History:  Procedure Laterality Date  . EYE SURGERY    . PACEMAKER INSERTION  2003  . PPM GENERATOR CHANGEOUT N/A 11/26/2016   Procedure: PPM GENERATOR CHANGEOUT;  Surgeon: Constance Haw, MD;  Location: [facility];  Service: Cardiovascular;  Laterality: N/A;  . PPM GENERATOR CHANGEOUT N/A 11/15/2017   Procedure: PPM GENERATOR CHANGEOUT;  Surgeon: Constance Haw, MD;  Location: [facility];  Service: Cardiovascular;  Laterality: N/A;     Current Outpatient Medications  Medication Sig Dispense Refill  . atorvastatin (LIPITOR) 20 MG tablet TAKE 1 TABLET BY MOUTH DAILY AT 6 PM (Patient taking differently: Take 20 mg by mouth daily. ) 90 tablet 3  . Calcium Citrate-Vitamin D (CALCIUM + D PO) Take 1 tablet by mouth 2 (two) times daily.     Marland Kitchen desonide (DESOWEN) 0.05 % ointment APP EXT AA BID FOR 14 DAYS UTD  0  . diclofenac sodium  (VOLTAREN) 1 % GEL Apply 2 g topically 4 (four) times daily. 100 g 6  . Glucosamine-Chondroitin (COSAMIN DS PO) Take 1 tablet by mouth 2 (two) times daily.    Marland Kitchen levothyroxine (SYNTHROID, LEVOTHROID) 75 MCG tablet Take 1 tablet (75 mcg total) by mouth daily. (Patient taking differently: Take 75 mcg by mouth daily before breakfast. ) 90 tablet 1  . Multiple Vitamins-Minerals (PRESERVISION AREDS 2) CAPS Take 1 capsule by mouth 2 (two) times daily.    Marland Kitchen omega-3 acid ethyl esters (LOVAZA) 1 g capsule TAKE 1 CAPSULE BY MOUTH TWICE DAILY 180 capsule 3  . raloxifene (EVISTA) 60 MG tablet TAKE 1 TABLET BY MOUTH DAILY 90 tablet 3  . triamcinolone cream (KENALOG) 0.1 % Apply 1 application topically 2 (two) times daily as needed (irritation).    . triamcinolone ointment (KENALOG) 0.1 % APP EXT TO THE SKIN BID FOR 14 DAYS  0   No current facility-administered medications for this visit.     Allergies:   Patient has no known allergies.   Social History:  The patient  reports that she has never smoked. She has never used smokeless tobacco. She reports that she does not drink alcohol or use drugs.   Family History:  [name] family history includes Arthritis in her mother; Heart disease in her brother and sister.   ROS:  Please see the history of present illness.   Otherwise, review of systems is positive  for back pain, muscle pain.   All other systems are reviewed and negative.   PHYSICAL EXAM: VS:  BP (!) 104/52   Pulse 82   Ht 4\' 10"  (1.473 m)   Wt 104 lb (47.2 kg)   SpO2 97%   BMI 21.74 kg/m  , BMI Body mass index is 21.74 kg/m. GEN: Well nourished, well developed, in no acute distress  HEENT: normal  Neck: no JVD, carotid bruits, or masses Cardiac: RRR; no murmurs, rubs, or gallops,no edema  Respiratory:  clear to auscultation bilaterally, normal work of breathing GI: soft, nontender, nondistended, + BS MS: no deformity or atrophy  Skin: warm and dry, device site well healed Neuro:   Strength and sensation are intact Psych: euthymic mood, full affect  EKG:  EKG is ordered today. Personal review of the ekg ordered shows sinus rhythm, ventricular paced  Personal review of the device interrogation today. Results in [facility]: 06/21/2017: ALT 14 11/15/2017: BUN 38; Creatinine, Ser 1.31; Hemoglobin 13.2; Platelets 255; Potassium 4.2; Sodium 144 02/01/2018: TSH 10.60    Lipid Panel     Component Value Date/Time   CHOL 135 06/21/2017 1050   TRIG 170.0 (H) 06/21/2017 1050   HDL 36.20 (L) 06/21/2017 1050   CHOLHDL 4 06/21/2017 1050   VLDL 34.0 06/21/2017 1050   LDLCALC 64 06/21/2017 1050   LDLDIRECT 61.0 06/23/2016 1321     Wt Readings from Last 3 Encounters:  03/01/18 104 lb (47.2 kg)  02/01/18 109 lb (49.4 kg)  11/15/17 108 lb (49 kg)   ASSESSMENT AND PLAN:  1.  Complete heart block: Status post Medtronic dual-chamber pacemaker with generator change 11/26/2016.  There was a recall on her device which was set to DDI where she had symptoms of weakness, fatigue, and shortness of breath.  Repeat generator change 11/15/2017.  Device functioning appropriately.  No changes at this time.    2. Hyperlipidemia: Continue atorvastatin per primary care.  Current medicines are reviewed at length with the patient today.   The patient does not have concerns regarding her medicines.  The following changes were made today: None  Labs/ tests ordered today include: none  Orders Placed This Encounter  Procedures  . EKG 12-Lead     Disposition:   FU with Jimmy Plessinger 12 months  Signed, Cylinda Santoli Meredith Leeds, MD  03/01/2018 3:07 PM     [place] [address] [place] [address] [phone] (office) [phone] (fax)

## 2018-03-01 NOTE — Patient Instructions (Signed)
Medication Instructions:  Your physician recommends that you continue on your current medications as directed. Please refer to the Current Medication list given to you today.  If you need a refill on your cardiac medications before your next appointment, please call your pharmacy.   Lab work: None ordered If you have labs (blood work) drawn today and your tests are completely normal, you will receive your results only by: Marland Kitchen MyChart Message (if you have MyChart) OR . A paper copy in the mail If you have any lab test that is abnormal or we need to change your treatment, we will call you to review the results.  Testing/Procedures: None ordered  Follow-Up: At [facility], you and your health needs are our priority.  As part of our continuing mission to provide you with exceptional heart care, we have created designated Provider Care Teams.  These Care Teams include your primary Cardiologist (physician) and Advanced Practice Providers (APPs -  Physician Assistants and Nurse Practitioners) who all work together to provide you with the care you need, when you need it. You will need a follow up appointment in 1 years.  Please call our office 2 months in advance to schedule this appointment.  You may see Will Meredith Leeds, MD or one of the following Advanced Practice Providers on your designated Care Team:   Chanetta Marshall, NP . Tommye Standard, PA-C  Thank you for choosing [facility]!!   Trinidad Curet, RN [phone]  Any Other Special Instructions Will Be Listed Below (If Applicable).

## 2018-03-03 ENCOUNTER — Encounter: Payer: Self-pay | Admitting: Cardiology

## 2018-03-03 LAB — CUP PACEART INCLINIC DEVICE CHECK
Brady Statistic AP VP Percent: 5 %
Brady Statistic AP VS Percent: 0 %
Brady Statistic AS VS Percent: 0 %
Date Time Interrogation Session: 20191203203713
Implantable Lead Implant Date: 20031112
Implantable Lead Location: 753859
Implantable Lead Location: 753860
Implantable Pulse Generator Implant Date: 20190819
Lead Channel Impedance Value: 584 Ohm
Lead Channel Impedance Value: 940 Ohm
Lead Channel Pacing Threshold Amplitude: 0.25 V
Lead Channel Pacing Threshold Amplitude: 0.5 V
Lead Channel Pacing Threshold Amplitude: 0.5 V
Lead Channel Pacing Threshold Pulse Width: 0.4 ms
Lead Channel Pacing Threshold Pulse Width: 0.4 ms
Lead Channel Setting Pacing Amplitude: 1.5 V
Lead Channel Setting Pacing Amplitude: 2 V
Lead Channel Setting Pacing Pulse Width: 0.4 ms
Lead Channel Setting Sensing Sensitivity: 5.6 mV
MDC IDC LEAD IMPLANT DT: 20031112
MDC IDC MSMT BATTERY IMPEDANCE: 100 Ohm
MDC IDC MSMT BATTERY REMAINING LONGEVITY: 160 mo
MDC IDC MSMT BATTERY VOLTAGE: 2.8 V
MDC IDC MSMT LEADCHNL RA PACING THRESHOLD AMPLITUDE: 0.5 V
MDC IDC MSMT LEADCHNL RA PACING THRESHOLD PULSEWIDTH: 0.4 ms
MDC IDC MSMT LEADCHNL RA SENSING INTR AMPL: 2 mV
MDC IDC MSMT LEADCHNL RV PACING THRESHOLD PULSEWIDTH: 0.4 ms
MDC IDC STAT BRADY AS VP PERCENT: 95 %

## 2018-03-31 ENCOUNTER — Telehealth: Payer: Self-pay

## 2018-03-31 NOTE — Telephone Encounter (Signed)
I have deleted the other pharmacies except the local one for ABX and etc

## 2018-03-31 NOTE — Telephone Encounter (Signed)
Copied from [place] [id number]. Topic: General - Other >> Mar 29, 2018  1:07 PM Yvette Rack wrote: Reason for CRM: Pt daughter Joycelyn Schmid stated pt received letter asking that she contact office to advise of pharmacy change to [facility]. Updated pt preferred pharmacy to [facility].

## 2018-04-02 ENCOUNTER — Inpatient Hospital Stay (HOSPITAL_COMMUNITY)
Admission: EM | Admit: 2018-04-02 | Discharge: 2018-04-09 | DRG: 193 | Disposition: A | Discharge status: Hospice | Payer: Medicare Other | Attending: Internal Medicine | Admitting: Internal Medicine

## 2018-04-02 ENCOUNTER — Other Ambulatory Visit: Payer: Self-pay

## 2018-04-02 ENCOUNTER — Encounter (HOSPITAL_COMMUNITY): Payer: Self-pay | Admitting: Emergency Medicine

## 2018-04-02 ENCOUNTER — Emergency Department (HOSPITAL_COMMUNITY): Payer: Medicare Other

## 2018-04-02 DIAGNOSIS — E039 Hypothyroidism, unspecified: Secondary | ICD-10-CM | POA: Diagnosis present

## 2018-04-02 DIAGNOSIS — N170 Acute kidney failure with tubular necrosis: Secondary | ICD-10-CM | POA: Diagnosis not present

## 2018-04-02 DIAGNOSIS — Z950 Presence of cardiac pacemaker: Secondary | ICD-10-CM

## 2018-04-02 DIAGNOSIS — Z79890 Hormone replacement therapy (postmenopausal): Secondary | ICD-10-CM | POA: Diagnosis not present

## 2018-04-02 DIAGNOSIS — J189 Pneumonia, unspecified organism: Principal | ICD-10-CM

## 2018-04-02 DIAGNOSIS — R4701 Aphasia: Secondary | ICD-10-CM | POA: Diagnosis not present

## 2018-04-02 DIAGNOSIS — Z79899 Other long term (current) drug therapy: Secondary | ICD-10-CM | POA: Diagnosis not present

## 2018-04-02 DIAGNOSIS — R058 Other specified cough: Secondary | ICD-10-CM

## 2018-04-02 DIAGNOSIS — Z8261 Family history of arthritis: Secondary | ICD-10-CM

## 2018-04-02 DIAGNOSIS — A419 Sepsis, unspecified organism: Secondary | ICD-10-CM | POA: Clinically undetermined

## 2018-04-02 DIAGNOSIS — R7989 Other specified abnormal findings of blood chemistry: Secondary | ICD-10-CM | POA: Diagnosis present

## 2018-04-02 DIAGNOSIS — I442 Atrioventricular block, complete: Secondary | ICD-10-CM | POA: Diagnosis present

## 2018-04-02 DIAGNOSIS — R778 Other specified abnormalities of plasma proteins: Secondary | ICD-10-CM | POA: Diagnosis present

## 2018-04-02 DIAGNOSIS — R072 Precordial pain: Secondary | ICD-10-CM | POA: Diagnosis present

## 2018-04-02 DIAGNOSIS — Z85828 Personal history of other malignant neoplasm of skin: Secondary | ICD-10-CM | POA: Diagnosis not present

## 2018-04-02 DIAGNOSIS — E8770 Fluid overload, unspecified: Secondary | ICD-10-CM | POA: Diagnosis present

## 2018-04-02 DIAGNOSIS — E785 Hyperlipidemia, unspecified: Secondary | ICD-10-CM | POA: Diagnosis present

## 2018-04-02 DIAGNOSIS — J9601 Acute respiratory failure with hypoxia: Secondary | ICD-10-CM | POA: Diagnosis present

## 2018-04-02 DIAGNOSIS — I639 Cerebral infarction, unspecified: Secondary | ICD-10-CM | POA: Diagnosis not present

## 2018-04-02 DIAGNOSIS — Z8249 Family history of ischemic heart disease and other diseases of the circulatory system: Secondary | ICD-10-CM | POA: Diagnosis not present

## 2018-04-02 DIAGNOSIS — Z66 Do not resuscitate: Secondary | ICD-10-CM | POA: Diagnosis not present

## 2018-04-02 DIAGNOSIS — M8949 Other hypertrophic osteoarthropathy, multiple sites: Secondary | ICD-10-CM | POA: Diagnosis present

## 2018-04-02 DIAGNOSIS — Z515 Encounter for palliative care: Secondary | ICD-10-CM

## 2018-04-02 DIAGNOSIS — T380X5A Adverse effect of glucocorticoids and synthetic analogues, initial encounter: Secondary | ICD-10-CM | POA: Diagnosis not present

## 2018-04-02 DIAGNOSIS — Y92239 Unspecified place in hospital as the place of occurrence of the external cause: Secondary | ICD-10-CM | POA: Diagnosis not present

## 2018-04-02 DIAGNOSIS — I249 Acute ischemic heart disease, unspecified: Secondary | ICD-10-CM

## 2018-04-02 DIAGNOSIS — R9389 Abnormal findings on diagnostic imaging of other specified body structures: Secondary | ICD-10-CM | POA: Diagnosis present

## 2018-04-02 DIAGNOSIS — R0902 Hypoxemia: Secondary | ICD-10-CM

## 2018-04-02 DIAGNOSIS — H409 Unspecified glaucoma: Secondary | ICD-10-CM | POA: Diagnosis present

## 2018-04-02 DIAGNOSIS — M810 Age-related osteoporosis without current pathological fracture: Secondary | ICD-10-CM | POA: Diagnosis present

## 2018-04-02 DIAGNOSIS — R05 Cough: Secondary | ICD-10-CM

## 2018-04-02 DIAGNOSIS — I2699 Other pulmonary embolism without acute cor pulmonale: Secondary | ICD-10-CM

## 2018-04-02 DIAGNOSIS — R6520 Severe sepsis without septic shock: Secondary | ICD-10-CM | POA: Diagnosis not present

## 2018-04-02 DIAGNOSIS — I2609 Other pulmonary embolism with acute cor pulmonale: Secondary | ICD-10-CM | POA: Diagnosis present

## 2018-04-02 DIAGNOSIS — R0602 Shortness of breath: Secondary | ICD-10-CM

## 2018-04-02 DIAGNOSIS — J811 Chronic pulmonary edema: Secondary | ICD-10-CM | POA: Diagnosis present

## 2018-04-02 DIAGNOSIS — G8929 Other chronic pain: Secondary | ICD-10-CM | POA: Diagnosis present

## 2018-04-02 HISTORY — DX: Pneumonia, unspecified organism: J18.9

## 2018-04-02 HISTORY — DX: Other pulmonary embolism without acute cor pulmonale: I26.99

## 2018-04-02 HISTORY — DX: Hypothyroidism, unspecified: E03.9

## 2018-04-02 HISTORY — DX: Unspecified malignant neoplasm of skin, unspecified: C44.90

## 2018-04-02 LAB — CBC WITH DIFFERENTIAL/PLATELET
ABS IMMATURE GRANULOCYTES: 0.07 10*3/uL (ref 0.00–0.07)
Basophils Absolute: 0.1 10*3/uL (ref 0.0–0.1)
Basophils Relative: 0 %
EOS ABS: 0.4 10*3/uL (ref 0.0–0.5)
Eosinophils Relative: 3 %
HCT: 39.5 % (ref 36.0–46.0)
Hemoglobin: 12.6 g/dL (ref 12.0–15.0)
Immature Granulocytes: 1 %
Lymphocytes Relative: 20 %
Lymphs Abs: 3 10*3/uL (ref 0.7–4.0)
MCH: 30.7 pg (ref 26.0–34.0)
MCHC: 31.9 g/dL (ref 30.0–36.0)
MCV: 96.1 fL (ref 80.0–100.0)
Monocytes Absolute: 1.3 10*3/uL — ABNORMAL HIGH (ref 0.1–1.0)
Monocytes Relative: 9 %
Neutro Abs: 10.3 10*3/uL — ABNORMAL HIGH (ref 1.7–7.7)
Neutrophils Relative %: 67 %
PLATELETS: 431 10*3/uL — AB (ref 150–400)
RBC: 4.11 MIL/uL (ref 3.87–5.11)
RDW: 12.8 % (ref 11.5–15.5)
WBC: 15.2 10*3/uL — ABNORMAL HIGH (ref 4.0–10.5)
nRBC: 0 % (ref 0.0–0.2)

## 2018-04-02 LAB — COMPREHENSIVE METABOLIC PANEL
ALT: 20 U/L (ref 0–44)
AST: 32 U/L (ref 15–41)
Albumin: 2.9 g/dL — ABNORMAL LOW (ref 3.5–5.0)
Alkaline Phosphatase: 63 U/L (ref 38–126)
Anion gap: 7 (ref 5–15)
BUN: 41 mg/dL — ABNORMAL HIGH (ref 8–23)
CALCIUM: 9.1 mg/dL (ref 8.9–10.3)
CO2: 25 mmol/L (ref 22–32)
Chloride: 99 mmol/L (ref 98–111)
Creatinine, Ser: 1.33 mg/dL — ABNORMAL HIGH (ref 0.44–1.00)
GFR calc Af Amer: 40 mL/min — ABNORMAL LOW (ref >60)
GFR, EST NON AFRICAN AMERICAN: 34 mL/min — AB (ref >60)
Glucose, Bld: 116 mg/dL — ABNORMAL HIGH (ref 70–99)
Potassium: 4.4 mmol/L (ref 3.5–5.1)
Sodium: 131 mmol/L — ABNORMAL LOW (ref 135–145)
Total Bilirubin: 0.5 mg/dL (ref 0.3–1.2)
Total Protein: 7.7 g/dL (ref 6.5–8.1)

## 2018-04-02 LAB — I-STAT TROPONIN, ED: TROPONIN I, POC: 0.92 ng/mL — AB (ref 0.00–0.08)

## 2018-04-02 LAB — D-DIMER, QUANTITATIVE: D-Dimer, Quant: 1.84 ug/mL-FEU — ABNORMAL HIGH (ref 0.00–0.50)

## 2018-04-02 LAB — I-STAT CG4 LACTIC ACID, ED: Lactic Acid, Venous: 1.07 mmol/L (ref 0.5–1.9)

## 2018-04-02 LAB — INFLUENZA PANEL BY PCR (TYPE A & B)
Influenza A By PCR: NEGATIVE
Influenza B By PCR: NEGATIVE

## 2018-04-02 LAB — BRAIN NATRIURETIC PEPTIDE: B Natriuretic Peptide: 599.7 pg/mL — ABNORMAL HIGH (ref 0.0–100.0)

## 2018-04-02 MED ORDER — IOPAMIDOL (ISOVUE-370) INJECTION 76%
75.0000 mL | Freq: Once | INTRAVENOUS | Status: AC | PRN
  Administered 2018-04-02: 75 mL via INTRAVENOUS

## 2018-04-02 MED ORDER — IOPAMIDOL (ISOVUE-370) INJECTION 76%
INTRAVENOUS | Status: AC
  Filled 2018-04-02: qty 100

## 2018-04-02 MED ORDER — SODIUM CHLORIDE 0.9 % IV SOLN
2.0000 g | INTRAVENOUS | Status: DC
  Administered 2018-04-02 – 2018-04-03 (×2): 2 g via INTRAVENOUS
  Filled 2018-04-02 (×2): qty 20

## 2018-04-02 MED ORDER — HEPARIN (PORCINE) 25000 UT/250ML-% IV SOLN
550.0000 [IU]/h | INTRAVENOUS | Status: DC
  Administered 2018-04-03: 700 [IU]/h via INTRAVENOUS
  Administered 2018-04-04: 800 [IU]/h via INTRAVENOUS
  Administered 2018-04-05: 700 [IU]/h via INTRAVENOUS
  Administered 2018-04-07: 550 [IU]/h via INTRAVENOUS
  Filled 2018-04-02 (×4): qty 250

## 2018-04-02 MED ORDER — SODIUM CHLORIDE 0.9 % IV SOLN
500.0000 mg | INTRAVENOUS | Status: DC
  Administered 2018-04-02 – 2018-04-06 (×5): 500 mg via INTRAVENOUS
  Filled 2018-04-02 (×5): qty 500

## 2018-04-02 MED ORDER — HEPARIN BOLUS VIA INFUSION
2000.0000 [IU] | Freq: Once | INTRAVENOUS | Status: AC
  Administered 2018-04-03: 2000 [IU] via INTRAVENOUS
  Filled 2018-04-02: qty 2000

## 2018-04-02 MED ORDER — FUROSEMIDE 10 MG/ML IJ SOLN
20.0000 mg | Freq: Once | INTRAMUSCULAR | Status: AC
  Administered 2018-04-02: 20 mg via INTRAVENOUS
  Filled 2018-04-02: qty 2

## 2018-04-02 NOTE — ED Notes (Signed)
Patient currently at CT scan .  

## 2018-04-02 NOTE — ED Triage Notes (Signed)
Per [facility] pt coming from home, lives with family. C/o chest pressure resolved after EMS administered oxygen. Pain started at 1430 for about 30 mins. Associated with shortness of breathe, 84% on room air. Patient currently on 4L [place].

## 2018-04-02 NOTE — Consult Note (Signed)
Cardiology Consult    Patient ID: Gloria Gonzalez MRN: [id number], DOB/AGE: 09/10/1921   Admit date: 04/02/2018 Date of Consult: 04/02/2018  Primary Physician: Hoyt Koch, MD Primary Cardiologist: No primary care provider on file. Requesting Provider: Coralyn Helling.    Past Medical History   Past Medical History:  Diagnosis Date  . Arrhythmia   . Arthritis   . Complete heart block ([place])   . Glaucoma   . Hyperlipidemia   . Thyroid disease     Past Surgical History:  Procedure Laterality Date  . EYE SURGERY    . PACEMAKER INSERTION  2003  . PPM GENERATOR CHANGEOUT N/A 11/26/2016   Procedure: PPM GENERATOR CHANGEOUT;  Surgeon: Constance Haw, MD;  Location: [facility];  Service: Cardiovascular;  Laterality: N/A;  . PPM GENERATOR CHANGEOUT N/A 11/15/2017   Procedure: PPM GENERATOR CHANGEOUT;  Surgeon: Constance Haw, MD;  Location: [facility];  Service: Cardiovascular;  Laterality: N/A;     Allergies  No Known Allergies  History of Present Illness    85 year old woman with a past medical history of complete heart block and Medtronic pacemaker device.  She presents with a week long history of progressive fatigue and shortness of breath.  She denies fevers.  Her other symptoms include chest pressure on the left side which was the actual precipitating reason to present day to the hospital.  Upon presentation is found to be hypoxic in the 80s.  Normal blood pressure.  Initial labs show a elevated troponin and elevated creatinine.  An elevated d-dimer was noted as well and CT chest with contrast was done.  Read pending  Inpatient Medications      Family History    Family History  Problem Relation Age of Onset  . Arthritis Mother   . Heart disease Sister   . Heart disease Brother    She indicated that her mother is deceased. She indicated that her father is deceased. She indicated that her sister is deceased. She indicated that her brother  is deceased. She indicated that her maternal grandmother is deceased. She indicated that her maternal grandfather is deceased. She indicated that her paternal grandmother is deceased. She indicated that her paternal grandfather is deceased.   Social History    Social History   Socioeconomic History  . Marital status: Married    Spouse name: Not on file  . Number of children: Not on file  . Years of education: Not on file  . Highest education level: Not on file  Occupational History  . Not on file  Social Needs  . Financial resource strain: Not on file  . Food insecurity:    Worry: Not on file    Inability: Not on file  . Transportation needs:    Medical: Not on file    Non-medical: Not on file  Tobacco Use  . Smoking status: Never Smoker  . Smokeless tobacco: Never Used  Substance and Sexual Activity  . Alcohol use: No    Alcohol/week: 0.0 standard drinks  . Drug use: No  . Sexual activity: Not on file  Lifestyle  . Physical activity:    Days per week: Not on file    Minutes per session: Not on file  . Stress: Not on file  Relationships  . Social connections:    Talks on phone: Not on file    Gets together: Not on file    Attends religious service: Not on file  Active member of club or organization: Not on file    Attends meetings of clubs or organizations: Not on file    Relationship status: Not on file  . Intimate partner violence:    Fear of current or ex partner: Not on file    Emotionally abused: Not on file    Physically abused: Not on file    Forced sexual activity: Not on file  Other Topics Concern  . Not on file  Social History Narrative  . Not on file     Review of Systems    General:  No chills, fever, night sweats or weight changes.  Cardiovascular:  No chest pain, dyspnea on exertion, edema, orthopnea, palpitations, paroxysmal nocturnal dyspnea. Dermatological: No rash, lesions/masses Respiratory: No cough, dyspnea Urologic: No hematuria,  dysuria Abdominal:   No nausea, vomiting, diarrhea, bright red blood per rectum, melena, or hematemesis Neurologic:  No visual changes, wkns, changes in mental status. All other systems reviewed and are otherwise negative except as noted above.  Physical Exam    Blood pressure 113/62, pulse 90, temperature 98.8 F (37.1 C), temperature source Oral, resp. rate (!) 22, height 4' 11"  (1.499 m), weight 47.2 kg, SpO2 97 %.  General: Oxygen cannula in place mild distress Psych: Normal affect. Neuro: Alert and oriented X 3. Moves all extremities spontaneously. HEENT: Normal  Neck: Elevated jugular venous pressure Lungs:  Resp regular and unlabored, CTA. Heart: Systolic murmur Abdomen: Soft, non-tender, non-distended, BS + x 4.  Extremities: No edema warm to touch  Labs    Troponin [facility] of Care Test) Recent Labs    04/02/18 1955  TROPIPOC 0.92*   No results for input(s): CKTOTAL, CKMB, TROPONINI in the last 72 hours. Lab Results  Component Value Date   WBC 15.2 (H) 04/02/2018   HGB 12.6 04/02/2018   HCT 39.5 04/02/2018   MCV 96.1 04/02/2018   PLT 431 (H) 04/02/2018    Recent Labs  Lab 04/02/18 1855  NA 131*  K 4.4  CL 99  CO2 25  BUN 41*  CREATININE 1.33*  CALCIUM 9.1  PROT 7.7  BILITOT 0.5  ALKPHOS 63  ALT 20  AST 32  GLUCOSE 116*   Lab Results  Component Value Date   CHOL 135 06/21/2017   HDL 36.20 (L) 06/21/2017   LDLCALC 64 06/21/2017   TRIG 170.0 (H) 06/21/2017   Lab Results  Component Value Date   DDIMER 1.84 (H) 04/02/2018     Radiology Studies    Dg Chest 2 View  Result Date: 04/02/2018 CLINICAL DATA:  Chest pressure, chest pain EXAM: CHEST - 2 VIEW COMPARISON:  06/21/2017 FINDINGS: LEFT subclavian pacemaker with leads projecting at RIGHT atrium and RIGHT ventricle unchanged. Upper normal heart size. Atherosclerotic calcification of thoracic aorta. Mediastinal contours normal. Diffuse BILATERAL pulmonary infiltrates question edema versus  infection. Underlying chronic bronchitic changes. No definite pleural effusion or pneumothorax. Bones diffusely demineralized. IMPRESSION: Diffuse BILATERAL pulmonary infiltrates question pulmonary edema versus infection. Electronically Signed   By: Lavonia Dana M.D.   On: 04/02/2018 20:00    ECG & Cardiac Imaging    Paced rhythm  Chest x-ray with bilateral pulmonary edema.  On my read the CT chest suggest that the bilateral edema could also hide some component of pneumonia.  Assessment & Plan    ACS: 85 year old woman not known coronary artery disease.  Last echo years ago with preserved LVEF severe MAC with mild MR.  Now with an NSTEMI.  Currently asymptomatic.  Also with evidence of acute heart failure.  Volume overload on physical exam.  Also with evidence of a pneumonia.  Patient is at an advanced age but appears to be quite functional and lives by herself.  Mentally she is fully there.  Recommendation: -Suggest to initiate IV diuresis. 40 IV Lasix twice daily. -Echo in the morning -Start on aspirin and heparin for ACS -We will need to consider to go to the Cath Lab on Monday but can eat for now -Start on atorvastatin 40 mg - agree with antibiotic coverage  Signed, Cristina Gong, MD 04/02/2018, 10:27 PM  For questions or updates, please contact   Please consult [email or web address] for contact info under Cardiology/STEMI.

## 2018-04-02 NOTE — ED Provider Notes (Signed)
Emergency Department Provider Note   I have reviewed the triage vital signs and the nursing notes.   HISTORY  Chief Complaint Chest Pain and Shortness of Breath   HPI Gloria Gonzalez is a 85 y.o. female with PMH of complete heart block with [employer] pacemaker and HLD presents to the emergency department for evaluation of generalized weakness worsening over the past 7 days but acute onset central chest pressure and shortness of breath.  Family state that they had noticed some decreased activity levels over the past 7 days.  They were planning on going to the PCP on Monday.  Today around 5 PM they went to check on the patient in her room and saw her laying back.  She had some blue discoloration over her fingers and was complaining of chest pressure.  Patient states she is not having chest pain or pressure at this time.  She is not having shortness of breath.  Family has not noticed fever.  Others in the household have had URI symptoms.   Past Medical History:  Diagnosis Date  . Arrhythmia   . Arthritis   . Complete heart block ([place])   . Glaucoma   . Hyperlipidemia   . Thyroid disease     Patient Active Problem List   Diagnosis Date Noted  . Acute pulmonary embolism with acute cor pulmonale ([place]) 04/03/2018  . Infiltrate noted on imaging study 04/03/2018  . Elevated troponin 04/03/2018  . Chronic pain of both knees 02/02/2018  . Cough 06/24/2017  . Acute bilateral low back pain with right-sided sciatica 07/14/2016  . Primary osteoarthritis involving multiple joints 12/26/2015  . Hyperlipidemia 06/21/2015  . Pacemaker 12/22/2014  . Hypothyroidism 12/22/2014  . Glaucoma 12/22/2014  . Osteoporosis 12/22/2014    Past Surgical History:  Procedure Laterality Date  . EYE SURGERY    . PACEMAKER INSERTION  2003  . PPM GENERATOR CHANGEOUT N/A 11/26/2016   Procedure: PPM GENERATOR CHANGEOUT;  Surgeon: Constance Haw, MD;  Location: [facility];  Service:  Cardiovascular;  Laterality: N/A;  . PPM GENERATOR CHANGEOUT N/A 11/15/2017   Procedure: PPM GENERATOR CHANGEOUT;  Surgeon: Constance Haw, MD;  Location: [facility];  Service: Cardiovascular;  Laterality: N/A;    Allergies Patient has no known allergies.  Family History  Problem Relation Age of Onset  . Arthritis Mother   . Heart disease Sister   . Heart disease Brother     Social History Social History   Tobacco Use  . Smoking status: Never Smoker  . Smokeless tobacco: Never Used  Substance Use Topics  . Alcohol use: No    Alcohol/week: 0.0 standard drinks  . Drug use: No    Review of Systems  Constitutional: No fever/chills. Positive generalized weakness.  Eyes: No visual changes. ENT: No sore throat. Cardiovascular: Positive chest pain. Respiratory: Positive shortness of breath. Gastrointestinal: No abdominal pain.  No nausea, no vomiting.  No diarrhea.  No constipation. Genitourinary: Negative for dysuria. Musculoskeletal: Negative for back pain. Skin: Negative for rash. Neurological: Negative for headaches, focal weakness or numbness.  10-point ROS otherwise negative.  ____________________________________________   PHYSICAL EXAM:  VITAL SIGNS: ED Triage Vitals  Enc Vitals Group     BP 04/02/18 1806 135/67     Pulse Rate 04/02/18 1806 96     Resp 04/02/18 1806 (!) 34     Temp 04/02/18 1806 98.8 F (37.1 C)     Temp Source 04/02/18 1806 Oral  SpO2 04/02/18 1806 93 %     Weight 04/02/18 1818 104 lb (47.2 kg)     Height 04/02/18 1818 4\' 11"  (1.499 m)   Constitutional: Alert and oriented. Well appearing and in no acute distress. Eyes: Conjunctivae are normal.  Head: Atraumatic. Nose: No congestion/rhinnorhea. Mouth/Throat: Mucous membranes are moist.   Neck: No stridor.  Cardiovascular: Normal rate, regular rhythm. Good peripheral circulation. Grossly normal heart sounds.   Respiratory: Normal respiratory effort.  No retractions.  Lungs with crackles at the bases.  Gastrointestinal: Soft and nontender. No distention.  Musculoskeletal: No lower extremity tenderness with trace edema in the bilateral LEs. No gross deformities of extremities. Neurologic:  Normal speech and language. No gross focal neurologic deficits are appreciated.  Skin:  Skin is warm, dry and intact. No rash noted.  ____________________________________________   LABS (all labs ordered are listed, but only abnormal results are displayed)  Labs Reviewed  COMPREHENSIVE METABOLIC PANEL - Abnormal; Notable for the following components:      Result Value   Sodium 131 (*)    Glucose, Bld 116 (*)    BUN 41 (*)    Creatinine, Ser 1.33 (*)    Albumin 2.9 (*)    GFR calc non Af Amer 34 (*)    GFR calc Af Amer 40 (*)    All other components within normal limits  CBC WITH DIFFERENTIAL/PLATELET - Abnormal; Notable for the following components:   WBC 15.2 (*)    Platelets 431 (*)    Neutro Abs 10.3 (*)    Monocytes Absolute 1.3 (*)    All other components within normal limits  BRAIN NATRIURETIC PEPTIDE - Abnormal; Notable for the following components:   B Natriuretic Peptide 599.7 (*)    All other components within normal limits  D-DIMER, QUANTITATIVE (NOT AT [facility]) - Abnormal; Notable for the following components:   D-Dimer, Quant 1.84 (*)    All other components within normal limits  TROPONIN I - Abnormal; Notable for the following components:   Troponin I 1.08 (*)    All other components within normal limits  TROPONIN I - Abnormal; Notable for the following components:   Troponin I 1.08 (*)    All other components within normal limits  HEPARIN LEVEL (UNFRACTIONATED) - Abnormal; Notable for the following components:   Heparin Unfractionated 0.25 (*)    All other components within normal limits  BASIC METABOLIC PANEL - Abnormal; Notable for the following components:   Glucose, Bld 109 (*)    BUN 38 (*)    Creatinine, Ser 1.41 (*)    Calcium 8.5  (*)    GFR calc non Af Amer 32 (*)    GFR calc Af Amer 37 (*)    All other components within normal limits  CBC - Abnormal; Notable for the following components:   WBC 16.5 (*)    RBC 3.67 (*)    Hemoglobin 11.7 (*)    HCT 35.3 (*)    Platelets 459 (*)    All other components within normal limits  I-STAT TROPONIN, ED - Abnormal; Notable for the following components:   Troponin i, poc 0.92 (*)    All other components within normal limits  CULTURE, BLOOD (ROUTINE X 2)  CULTURE, BLOOD (ROUTINE X 2)  RESPIRATORY PANEL BY PCR  INFLUENZA PANEL BY PCR (TYPE A & B)  PROCALCITONIN  TROPONIN I  HEPARIN LEVEL (UNFRACTIONATED)  I-STAT CG4 LACTIC ACID, ED   ____________________________________________  EKG   EKG Interpretation  Date/Time:  Saturday April 02 2018 19:43:37 EST Ventricular Rate:  88 PR Interval:    QRS Duration: 144 QT Interval:  419 QTC Calculation: 507 R Axis:   -81 Text Interpretation:  Sinus rhythm Probable left atrial enlargement Nonspecific IVCD with LAD Left ventricular hypertrophy Anterior infarct, old Lateral leads are also involved No STEMI.  Confirmed by Nanda Quinton [phone]) on 04/02/2018 7:47:56 PM       ____________________________________________  RADIOLOGY  Dg Chest 2 View  Result Date: 04/02/2018 CLINICAL DATA:  Chest pressure, chest pain EXAM: CHEST - 2 VIEW COMPARISON:  06/21/2017 FINDINGS: LEFT subclavian pacemaker with leads projecting at RIGHT atrium and RIGHT ventricle unchanged. Upper normal heart size. Atherosclerotic calcification of thoracic aorta. Mediastinal contours normal. Diffuse BILATERAL pulmonary infiltrates question edema versus infection. Underlying chronic bronchitic changes. No definite pleural effusion or pneumothorax. Bones diffusely demineralized. IMPRESSION: Diffuse BILATERAL pulmonary infiltrates question pulmonary edema versus infection. Electronically Signed   By: Lavonia Dana M.D.   On: 04/02/2018 20:00   Ct Angio Chest Pe  W And/or Wo Contrast  Result Date: 04/02/2018 CLINICAL DATA:  Chest pressure, shortness of breath, elevated D-dimer, history of heart block EXAM: CT ANGIOGRAPHY CHEST WITH CONTRAST TECHNIQUE: Multidetector CT imaging of the chest was performed using the standard protocol during bolus administration of intravenous contrast. Multiplanar CT image reconstructions and MIPs were obtained to evaluate the vascular anatomy. CONTRAST:  4mL ISOVUE-370 IOPAMIDOL (ISOVUE-370) INJECTION 76% IV COMPARISON:  None. FINDINGS: Cardiovascular: Extensive atherosclerotic calcifications of the thoracic aorta, coronary arteries and proximal great vessels. Aorta normal caliber. Pulmonary arteries adequately opacified. Small filling defects are identified within RIGHT upper lobe and RIGHT lower lobe pulmonary arteries consistent with small pulmonary emboli. No large or central pulmonary emboli are identified. Mediastinum/Nodes: Esophagus unremarkable. Base of cervical region normal appearance. No definite thoracic adenopathy. Few scattered normal size mediastinal lymph nodes. Upper normal sized 10 mm azygo-esophageal recess node. Lungs/Pleura: Extensive patchy airspace infiltrates throughout BILATERAL upper lobes, RIGHT middle lobe, minimally in lower lobes, favor multifocal pneumonia over edema. Minimal pleural effusions bilaterally. No pneumothorax or definite pulmonary mass. Upper Abdomen: Probable tiny nonobstructing calculus at upper pole LEFT kidney. Remaining visualized upper abdomen unremarkable. Musculoskeletal: Osseous demineralization with multiple thoracic spine compression deformities. Review of the MIP images confirms the above findings. IMPRESSION: Small pulmonary emboli identified within RIGHT upper lobe and RIGHT lower lobe pulmonary arterial branches. Extensive atherosclerotic calcifications of aorta, coronary arteries and proximal great vessels. BILATERAL patchy pulmonary infiltrates favor multifocal pneumonia over  edema. Osseous demineralization with multiple thoracic spine compression fractures. Aortic Atherosclerosis (ICD10-I70.0). Critical Value/emergent results were called by telephone at the time of interpretation on 04/02/2018 at 10:33 pm to Dr. Nanda Quinton , who verbally acknowledged these results. Electronically Signed   By: Lavonia Dana M.D.   On: 04/02/2018 22:34    ____________________________________________   PROCEDURES  Procedure(s) performed:   Procedures  CRITICAL CARE Performed by: Margette Fast Total critical care time: 35 minutes Critical care time was exclusive of separately billable procedures and treating other patients. Critical care was necessary to treat or prevent imminent or life-threatening deterioration. Critical care was time spent personally by me on the following activities: development of treatment plan with patient and/or surrogate as well as nursing, discussions with consultants, evaluation of patient's response to treatment, examination of patient, obtaining history from patient or surrogate, ordering and performing treatments and interventions, ordering and review of laboratory studies, ordering and review of radiographic studies, pulse oximetry and re-evaluation of  patient's condition.  Nanda Quinton, MD Emergency Medicine  ____________________________________________   INITIAL IMPRESSION / ASSESSMENT AND PLAN / ED COURSE  Pertinent labs & imaging results that were available during my care of the patient were reviewed by me and considered in my medical decision making (see chart for details).  Patient presents to the emergency department for evaluation of fatigue over the past several days with chest pain today and what sounds like cyanosis.  Patient is well-appearing and chest pain-free here.  Does have signs of mild pulmonary edema.  EKG shows a paced rhythm with no acute ischemia.  Labs from triage show troponin of 0.92.  I spoke with cardiology, Dr. Raiford Simmonds, who  will see the patient. Doubt ACS. D-dimer pending. With chest x-ray finding showing pulmonary edema versus infection I will start antibiotics as to members of the household have had respiratory symptoms.  Patient also with a white count of 15 but normal lactate.  Troponin elevation but likely 2/2 respiratory process. D-dimer elevated followed by CTA showing PE. Question associated respiratory infectious process as well. Starting abx but will admit for further treatment. No contraindications to anticoagulation.   Discussed patient's case with Hospitalist, Dr. Alcario Drought to request admission. Patient and family (if present) updated with plan. Care transferred to Hospitalist service.  I reviewed all nursing notes, vitals, pertinent old records, EKGs, labs, imaging (as available).  ____________________________________________  FINAL CLINICAL IMPRESSION(S) / ED DIAGNOSES  Final diagnoses:  Other acute pulmonary embolism without acute cor pulmonale ([facility])  Precordial chest pain  SOB (shortness of breath)  Hypoxemia     MEDICATIONS GIVEN DURING THIS VISIT:  Medications  cefTRIAXone (ROCEPHIN) 2 g in sodium chloride 0.9 % 100 mL IVPB (0 g Intravenous Stopped 04/02/18 2144)  azithromycin (ZITHROMAX) 500 mg in sodium chloride 0.9 % 250 mL IVPB (0 mg Intravenous Stopped 04/02/18 2144)  heparin ADULT infusion 100 units/mL (25000 units/232mL sodium chloride 0.45%) (800 Units/hr Intravenous Rate/Dose Change 04/03/18 0836)  acetaminophen (TYLENOL) tablet 650 mg (has no administration in time range)    Or  acetaminophen (TYLENOL) suppository 650 mg (has no administration in time range)  ondansetron (ZOFRAN) tablet 4 mg (has no administration in time range)    Or  ondansetron (ZOFRAN) injection 4 mg (has no administration in time range)  aspirin tablet 325 mg (325 mg Oral Given 04/03/18 0926)  furosemide (LASIX) injection 20 mg (20 mg Intravenous Given 04/02/18 2047)  iopamidol (ISOVUE-370) 76 % injection 75 mL  (75 mLs Intravenous Contrast Given 04/02/18 2158)  heparin bolus via infusion 2,000 Units (2,000 Units Intravenous Bolus from Bag 04/03/18 0006)  heparin bolus via infusion 700 Units (700 Units Intravenous Bolus from Bag 04/03/18 0838)   Note:  This document was prepared using Dragon voice recognition software and may include unintentional dictation errors.  Nanda Quinton, MD Emergency Medicine    Hazaiah [place], [facility], MD 04/03/18 [phone]

## 2018-04-02 NOTE — Progress Notes (Signed)
ANTICOAGULATION CONSULT NOTE - Initial Consult  Pharmacy Consult for Heparin  Indication: pulmonary embolus  No Known Allergies  Patient Measurements: Height: 4\' 11"  (149.9 cm) Weight: 104 lb (47.2 kg) IBW/kg (Calculated) : 43.2  Vital Signs: Temp: 98.8 F (37.1 C) (01/04 1806) Temp Source: Oral (01/04 1806) BP: 115/53 (01/04 2300) Pulse Rate: 89 (01/04 2300)  Labs: Recent Labs    04/02/18 1855  HGB 12.6  HCT 39.5  PLT 431*  CREATININE 1.33*    Estimated Creatinine Clearance: 17.6 mL/min (A) (by C-G formula based on SCr of 1.33 mg/dL (H)).   Medical History: Past Medical History:  Diagnosis Date  . Arrhythmia   . Arthritis   . Complete heart block ([place])   . Glaucoma   . Hyperlipidemia   . Thyroid disease     Assessment: 85 y/o F with CT angio confirmation of PE, starting heparin, CBC ok, mild bump in Scr, pta meds reviewed.    Goal of Therapy:  Heparin level 0.3-0.7 units/ml Monitor platelets by anticoagulation protocol: Yes   Plan:  Heparin 2000 units BOLUS Start heparin drip at 700 units/hr 0800 HL Daily CBC/HL Monitor for bleeding   Narda Bonds 04/02/2018,11:47 PM

## 2018-04-02 NOTE — ED Notes (Signed)
I Stat Trop I result of 0.92 reported to Dr. Laverta Baltimore

## 2018-04-03 ENCOUNTER — Inpatient Hospital Stay (HOSPITAL_COMMUNITY): Payer: Medicare Other

## 2018-04-03 DIAGNOSIS — Z515 Encounter for palliative care: Secondary | ICD-10-CM | POA: Diagnosis not present

## 2018-04-03 DIAGNOSIS — Z8261 Family history of arthritis: Secondary | ICD-10-CM | POA: Diagnosis not present

## 2018-04-03 DIAGNOSIS — E8770 Fluid overload, unspecified: Secondary | ICD-10-CM | POA: Diagnosis present

## 2018-04-03 DIAGNOSIS — I639 Cerebral infarction, unspecified: Secondary | ICD-10-CM | POA: Diagnosis not present

## 2018-04-03 DIAGNOSIS — H409 Unspecified glaucoma: Secondary | ICD-10-CM | POA: Diagnosis present

## 2018-04-03 DIAGNOSIS — I442 Atrioventricular block, complete: Secondary | ICD-10-CM | POA: Diagnosis present

## 2018-04-03 DIAGNOSIS — Z8249 Family history of ischemic heart disease and other diseases of the circulatory system: Secondary | ICD-10-CM | POA: Diagnosis not present

## 2018-04-03 DIAGNOSIS — E785 Hyperlipidemia, unspecified: Secondary | ICD-10-CM | POA: Diagnosis present

## 2018-04-03 DIAGNOSIS — J9601 Acute respiratory failure with hypoxia: Secondary | ICD-10-CM | POA: Diagnosis present

## 2018-04-03 DIAGNOSIS — E039 Hypothyroidism, unspecified: Secondary | ICD-10-CM | POA: Diagnosis present

## 2018-04-03 DIAGNOSIS — I2782 Chronic pulmonary embolism: Secondary | ICD-10-CM

## 2018-04-03 DIAGNOSIS — R9389 Abnormal findings on diagnostic imaging of other specified body structures: Secondary | ICD-10-CM | POA: Diagnosis not present

## 2018-04-03 DIAGNOSIS — R7989 Other specified abnormal findings of blood chemistry: Secondary | ICD-10-CM | POA: Diagnosis present

## 2018-04-03 DIAGNOSIS — Z79899 Other long term (current) drug therapy: Secondary | ICD-10-CM | POA: Diagnosis not present

## 2018-04-03 DIAGNOSIS — R778 Other specified abnormalities of plasma proteins: Secondary | ICD-10-CM | POA: Diagnosis present

## 2018-04-03 DIAGNOSIS — I248 Other forms of acute ischemic heart disease: Secondary | ICD-10-CM

## 2018-04-03 DIAGNOSIS — R4701 Aphasia: Secondary | ICD-10-CM | POA: Diagnosis not present

## 2018-04-03 DIAGNOSIS — R6520 Severe sepsis without septic shock: Secondary | ICD-10-CM | POA: Diagnosis not present

## 2018-04-03 DIAGNOSIS — M8949 Other hypertrophic osteoarthropathy, multiple sites: Secondary | ICD-10-CM | POA: Diagnosis present

## 2018-04-03 DIAGNOSIS — Y92239 Unspecified place in hospital as the place of occurrence of the external cause: Secondary | ICD-10-CM | POA: Diagnosis not present

## 2018-04-03 DIAGNOSIS — J189 Pneumonia, unspecified organism: Secondary | ICD-10-CM | POA: Diagnosis present

## 2018-04-03 DIAGNOSIS — Z66 Do not resuscitate: Secondary | ICD-10-CM | POA: Diagnosis not present

## 2018-04-03 DIAGNOSIS — N170 Acute kidney failure with tubular necrosis: Secondary | ICD-10-CM | POA: Diagnosis not present

## 2018-04-03 DIAGNOSIS — R072 Precordial pain: Secondary | ICD-10-CM

## 2018-04-03 DIAGNOSIS — Z950 Presence of cardiac pacemaker: Secondary | ICD-10-CM | POA: Diagnosis not present

## 2018-04-03 DIAGNOSIS — A419 Sepsis, unspecified organism: Secondary | ICD-10-CM | POA: Diagnosis not present

## 2018-04-03 DIAGNOSIS — I2699 Other pulmonary embolism without acute cor pulmonale: Secondary | ICD-10-CM

## 2018-04-03 DIAGNOSIS — Z85828 Personal history of other malignant neoplasm of skin: Secondary | ICD-10-CM | POA: Diagnosis not present

## 2018-04-03 DIAGNOSIS — J811 Chronic pulmonary edema: Secondary | ICD-10-CM | POA: Diagnosis present

## 2018-04-03 DIAGNOSIS — I351 Nonrheumatic aortic (valve) insufficiency: Secondary | ICD-10-CM | POA: Diagnosis not present

## 2018-04-03 DIAGNOSIS — Z79890 Hormone replacement therapy (postmenopausal): Secondary | ICD-10-CM | POA: Diagnosis not present

## 2018-04-03 DIAGNOSIS — T380X5A Adverse effect of glucocorticoids and synthetic analogues, initial encounter: Secondary | ICD-10-CM | POA: Diagnosis not present

## 2018-04-03 DIAGNOSIS — I2609 Other pulmonary embolism with acute cor pulmonale: Secondary | ICD-10-CM | POA: Diagnosis present

## 2018-04-03 LAB — TROPONIN I
Troponin I: 1.08 ng/mL (ref <0.03)
Troponin I: 1.08 ng/mL (ref <0.03)
Troponin I: 0.8 ng/mL (ref <0.03)

## 2018-04-03 LAB — RESPIRATORY PANEL BY PCR

## 2018-04-03 LAB — BASIC METABOLIC PANEL
Anion gap: 10 (ref 5–15)
BUN: 38 mg/dL — ABNORMAL HIGH (ref 8–23)
CHLORIDE: 102 mmol/L (ref 98–111)
CO2: 24 mmol/L (ref 22–32)
Calcium: 8.5 mg/dL — ABNORMAL LOW (ref 8.9–10.3)
Creatinine, Ser: 1.41 mg/dL — ABNORMAL HIGH (ref 0.44–1.00)
GFR calc Af Amer: 37 mL/min — ABNORMAL LOW (ref >60)
GFR calc non Af Amer: 32 mL/min — ABNORMAL LOW (ref >60)
Glucose, Bld: 109 mg/dL — ABNORMAL HIGH (ref 70–99)
Potassium: 4.3 mmol/L (ref 3.5–5.1)
Sodium: 136 mmol/L (ref 135–145)

## 2018-04-03 LAB — ECHOCARDIOGRAM COMPLETE
Height: 58 in
Weight: 1731.93 oz

## 2018-04-03 LAB — CBC
HEMATOCRIT: 35.3 % — AB (ref 36.0–46.0)
Hemoglobin: 11.7 g/dL — ABNORMAL LOW (ref 12.0–15.0)
MCH: 31.9 pg (ref 26.0–34.0)
MCHC: 33.1 g/dL (ref 30.0–36.0)
MCV: 96.2 fL (ref 80.0–100.0)
Platelets: 459 10*3/uL — ABNORMAL HIGH (ref 150–400)
RBC: 3.67 MIL/uL — ABNORMAL LOW (ref 3.87–5.11)
RDW: 12.9 % (ref 11.5–15.5)
WBC: 16.5 10*3/uL — ABNORMAL HIGH (ref 4.0–10.5)
nRBC: 0 % (ref 0.0–0.2)

## 2018-04-03 LAB — PROCALCITONIN: Procalcitonin: 0.12 ng/mL

## 2018-04-03 LAB — HEPARIN LEVEL (UNFRACTIONATED)
Heparin Unfractionated: 0.25 IU/mL — ABNORMAL LOW (ref 0.30–0.70)
Heparin Unfractionated: 0.59 IU/mL (ref 0.30–0.70)

## 2018-04-03 MED ORDER — ONDANSETRON HCL 4 MG/2ML IJ SOLN: 4.0000 mg | Freq: Four times a day (QID) | INTRAMUSCULAR | Status: DC | PRN

## 2018-04-03 MED ORDER — ALBUTEROL SULFATE (2.5 MG/3ML) 0.083% IN NEBU
2.5000 mg | INHALATION_SOLUTION | Freq: Once | RESPIRATORY_TRACT | Status: AC
  Administered 2018-04-03: 2.5 mg via RESPIRATORY_TRACT
  Filled 2018-04-03: qty 3

## 2018-04-03 MED ORDER — SODIUM CHLORIDE 0.9 % IV SOLN
INTRAVENOUS | Status: DC
  Administered 2018-04-03 – 2018-04-07 (×2): via INTRAVENOUS

## 2018-04-03 MED ORDER — ACETAMINOPHEN 325 MG PO TABS: 650.0000 mg | ORAL_TABLET | Freq: Four times a day (QID) | ORAL | Status: DC | PRN

## 2018-04-03 MED ORDER — ONDANSETRON HCL 4 MG PO TABS: 4.0000 mg | ORAL_TABLET | Freq: Four times a day (QID) | ORAL | Status: DC | PRN

## 2018-04-03 MED ORDER — LEVOTHYROXINE SODIUM 75 MCG PO TABS
75.0000 ug | ORAL_TABLET | Freq: Every day | ORAL | Status: DC
  Administered 2018-04-04 – 2018-04-07 (×4): 75 ug via ORAL
  Filled 2018-04-03 (×4): qty 1

## 2018-04-03 MED ORDER — HEPARIN BOLUS VIA INFUSION
700.0000 [IU] | Freq: Once | INTRAVENOUS | Status: AC
  Administered 2018-04-03: 700 [IU] via INTRAVENOUS
  Filled 2018-04-03: qty 700

## 2018-04-03 MED ORDER — DICLOFENAC SODIUM 1 % TD GEL
2.0000 g | Freq: Four times a day (QID) | TRANSDERMAL | Status: DC | PRN
  Filled 2018-04-03: qty 100

## 2018-04-03 MED ORDER — ACETAMINOPHEN 650 MG RE SUPP: 650.0000 mg | Freq: Four times a day (QID) | RECTAL | Status: DC | PRN

## 2018-04-03 MED ORDER — ASPIRIN 325 MG PO TABS
325.0000 mg | ORAL_TABLET | Freq: Every day | ORAL | Status: DC
  Administered 2018-04-03: 325 mg via ORAL
  Filled 2018-04-03: qty 1

## 2018-04-03 NOTE — Progress Notes (Signed)
VASCULAR LAB PRELIMINARY  PRELIMINARY  PRELIMINARY  PRELIMINARY  Bilateral lower extremity venous duplex completed.    Preliminary report:  There is no obvious evidence of DVT or SVT noted in the visualized veins of the bilateral lower extremities.   Katrin Grabel, RVT 04/03/2018, 12:15 PM

## 2018-04-03 NOTE — Progress Notes (Signed)
Progress Note  Patient Name: Gloria Gonzalez Date of Encounter: 04/03/2018  Primary Cardiologist: No primary care provider on file. [place]  Subjective   Feeling well currently.  No further chest discomfort or pressure.  No shortness of breath.  Inpatient Medications    Scheduled Meds: . aspirin  325 mg Oral Daily   Continuous Infusions: . azithromycin Stopped (04/02/18 2144)  . cefTRIAXone (ROCEPHIN)  IV Stopped (04/02/18 2117)  . heparin 800 Units/hr (04/03/18 0836)   PRN Meds: acetaminophen **OR** acetaminophen, ondansetron **OR** ondansetron (ZOFRAN) IV   Vital Signs    Vitals:   04/03/18 0330 04/03/18 0436 04/03/18 0802 04/03/18 1309  BP: (!) 119/99 (!) 135/53 (!) 107/47 (!) 110/46  Pulse: 83 89 79 79  Resp:  16 (!) 29 15  Temp:  98.6 F (37 C) 98.5 F (36.9 C) 98.2 F (36.8 C)  TempSrc:  Oral Oral Oral  SpO2: 95% 97% 94% 94%  Weight:  49.1 kg    Height:  4\' 10"  (1.473 m)      Intake/Output Summary (Last 24 hours) at 04/03/2018 1313 Last data filed at 04/03/2018 1000 Gross per 24 hour  Intake 397.76 ml  Output 120 ml  Net 277.76 ml   Filed Weights   04/02/18 1818 04/03/18 0436  Weight: 47.2 kg 49.1 kg    Telemetry    No adverse arrhythmias- Personally Reviewed  ECG    Sinus rhythm with interventricular conduction delay versus ventricular pacing- Personally Reviewed  Physical Exam   GEN:  Elderly no acute distress.   Neck: No JVD Cardiac: RRR, systolic right upper sternal border murmur, rubs, or gallops.  Respiratory: Clear to auscultation bilaterally. GI: Soft, nontender, non-distended  MS: No edema; No deformity. Neuro:  Nonfocal  Psych: Normal affect   Labs    Chemistry Recent Labs  Lab 04/02/18 1855 04/03/18 0712  NA 131* 136  K 4.4 4.3  CL 99 102  CO2 25 24  GLUCOSE 116* 109*  BUN 41* 38*  CREATININE 1.33* 1.41*  CALCIUM 9.1 8.5*  PROT 7.7  --   ALBUMIN 2.9*  --   AST 32  --   ALT 20  --   ALKPHOS 63  --   BILITOT 0.5   --   GFRNONAA 34* 32*  GFRAA 40* 37*  ANIONGAP 7 10     Hematology Recent Labs  Lab 04/02/18 1855 04/03/18 0712  WBC 15.2* 16.5*  RBC 4.11 3.67*  HGB 12.6 11.7*  HCT 39.5 35.3*  MCV 96.1 96.2  MCH 30.7 31.9  MCHC 31.9 33.1  RDW 12.8 12.9  PLT 431* 459*    Cardiac Enzymes Recent Labs  Lab 04/03/18 0007 04/03/18 0712  TROPONINI 1.08* 1.08*    Recent Labs  Lab 04/02/18 1955  TROPIPOC 0.92*     BNP Recent Labs  Lab 04/02/18 1855  BNP 599.7*     DDimer  Recent Labs  Lab 04/02/18 2046  DDIMER 1.84*     Radiology    Dg Chest 2 View  Result Date: 04/02/2018 CLINICAL DATA:  Chest pressure, chest pain EXAM: CHEST - 2 VIEW COMPARISON:  06/21/2017 FINDINGS: LEFT subclavian pacemaker with leads projecting at RIGHT atrium and RIGHT ventricle unchanged. Upper normal heart size. Atherosclerotic calcification of thoracic aorta. Mediastinal contours normal. Diffuse BILATERAL pulmonary infiltrates question edema versus infection. Underlying chronic bronchitic changes. No definite pleural effusion or pneumothorax. Bones diffusely demineralized. IMPRESSION: Diffuse BILATERAL pulmonary infiltrates question pulmonary edema versus infection. Electronically Signed   By:  Lavonia Dana M.D.   On: 04/02/2018 20:00   Ct Angio Chest Pe W And/or Wo Contrast  Result Date: 04/02/2018 CLINICAL DATA:  Chest pressure, shortness of breath, elevated D-dimer, history of heart block EXAM: CT ANGIOGRAPHY CHEST WITH CONTRAST TECHNIQUE: Multidetector CT imaging of the chest was performed using the standard protocol during bolus administration of intravenous contrast. Multiplanar CT image reconstructions and MIPs were obtained to evaluate the vascular anatomy. CONTRAST:  48mL ISOVUE-370 IOPAMIDOL (ISOVUE-370) INJECTION 76% IV COMPARISON:  None. FINDINGS: Cardiovascular: Extensive atherosclerotic calcifications of the thoracic aorta, coronary arteries and proximal great vessels. Aorta normal caliber.  Pulmonary arteries adequately opacified. Small filling defects are identified within RIGHT upper lobe and RIGHT lower lobe pulmonary arteries consistent with small pulmonary emboli. No large or central pulmonary emboli are identified. Mediastinum/Nodes: Esophagus unremarkable. Base of cervical region normal appearance. No definite thoracic adenopathy. Few scattered normal size mediastinal lymph nodes. Upper normal sized 10 mm azygo-esophageal recess node. Lungs/Pleura: Extensive patchy airspace infiltrates throughout BILATERAL upper lobes, RIGHT middle lobe, minimally in lower lobes, favor multifocal pneumonia over edema. Minimal pleural effusions bilaterally. No pneumothorax or definite pulmonary mass. Upper Abdomen: Probable tiny nonobstructing calculus at upper pole LEFT kidney. Remaining visualized upper abdomen unremarkable. Musculoskeletal: Osseous demineralization with multiple thoracic spine compression deformities. Review of the MIP images confirms the above findings. IMPRESSION: Small pulmonary emboli identified within RIGHT upper lobe and RIGHT lower lobe pulmonary arterial branches. Extensive atherosclerotic calcifications of aorta, coronary arteries and proximal great vessels. BILATERAL patchy pulmonary infiltrates favor multifocal pneumonia over edema. Osseous demineralization with multiple thoracic spine compression fractures. Aortic Atherosclerosis (ICD10-I70.0). Critical Value/emergent results were called by telephone at the time of interpretation on 04/02/2018 at 10:33 pm to Dr. Nanda Quinton , who verbally acknowledged these results. Electronically Signed   By: Lavonia Dana M.D.   On: 04/02/2018 22:34   Vas [place] Lower Extremity Venous (dvt)  Result Date: 04/03/2018  Lower Venous Study Risk Factors: Confirmed PE. Limitations: Body habitus and positioning. Comparison Study: No prior study on file Performing Technologist: Sharion Dove RVS  Examination Guidelines: A complete evaluation includes B-mode  imaging, spectral Doppler, color Doppler, and power Doppler as needed of all accessible portions of each vessel. Bilateral testing is considered an integral part of a complete examination. Limited examinations for reoccurring indications may be performed as noted.  Right Venous Findings: +---------+---------------+---------+-----------+----------+-------+          CompressibilityPhasicitySpontaneityPropertiesSummary +---------+---------------+---------+-----------+----------+-------+ CFV      Full           Yes      Yes                          +---------+---------------+---------+-----------+----------+-------+ SFJ      Full                                                 +---------+---------------+---------+-----------+----------+-------+ FV Prox  Full                                                 +---------+---------------+---------+-----------+----------+-------+ FV Mid   Full                                                 +---------+---------------+---------+-----------+----------+-------+  FV DistalFull                                                 +---------+---------------+---------+-----------+----------+-------+ PFV      Full                                                 +---------+---------------+---------+-----------+----------+-------+ POP      Full           Yes      Yes                          +---------+---------------+---------+-----------+----------+-------+ GSV      Full                                                 +---------+---------------+---------+-----------+----------+-------+  Right Technical Findings: Not visualized segments include Not all segments of the posterior tibial and peroneal veins.  Left Venous Findings: +---------+---------------+---------+-----------+----------+-------+          CompressibilityPhasicitySpontaneityPropertiesSummary +---------+---------------+---------+-----------+----------+-------+  CFV      Full           Yes      Yes                          +---------+---------------+---------+-----------+----------+-------+ SFJ      Full                                                 +---------+---------------+---------+-----------+----------+-------+ FV Prox  Full                                                 +---------+---------------+---------+-----------+----------+-------+ FV Mid   Full                                                 +---------+---------------+---------+-----------+----------+-------+ FV DistalFull                                                 +---------+---------------+---------+-----------+----------+-------+ PFV      Full                                                 +---------+---------------+---------+-----------+----------+-------+ POP      Full           Yes      Yes                          +---------+---------------+---------+-----------+----------+-------+  PTV      Full                                                 +---------+---------------+---------+-----------+----------+-------+ GSV      Full                                                 +---------+---------------+---------+-----------+----------+-------+  Left Technical Findings: Not visualized segments include Not all segments of the posterior tibial and peroneal veins.   Summary: Right: There is no evidence of deep vein thrombosis in the lower extremity. However, portions of this examination were limited- see technologist comments above.There is no evidence of superficial venous thrombosis. Ultrasound characteristics of enlarged lymph nodes are noted in the groin. Left: There is no evidence of deep vein thrombosis in the lower extremity. However, portions of this examination were limited- see technologist comments above.There is no evidence of superficial venous thrombosis. Ultrasound characteristics of enlarged lymph nodes noted in the groin.   *See table(s) above for measurements and observations. Electronically signed by Ruta Hinds MD on 04/03/2018 at 12:23:24 PM.    Final     Cardiac Studies   Echocardiogram 04/03/2018  - Left ventricle: The cavity size was normal. Wall thickness was   increased in a pattern of mild LVH. Systolic function was normal.   The estimated ejection fraction was in the range of 60% to 65%.   Wall motion was normal; there were no regional wall motion   abnormalities. - Aortic valve: Trileaflet; moderately thickened, moderately   calcified leaflets. Valve mobility was restricted. Transvalvular   velocity was within the normal range. There was no stenosis.   There was mild regurgitation. - Mitral valve: Severely calcified annulus. Moderately thickened,   moderately calcified leaflets . The findings are consistent with   moderate stenosis. - Pulmonary arteries: Systolic pressure was mildly increased. PA   peak pressure: 36 mm Hg (S).  Impressions:  - Compared to the prior study, there has been no significant   interval change.  Patient Profile     85 y.o. female with acute pulmonary embolism, elevated troponin  Assessment & Plan    Acute PE -Currently on IV heparin. -Troponin approximately 1.  Previous chest pressure likely a result of pulmonary emboli.  This is completely resolved, pain wise. -No further invasive testing.  I do not think this was true acute coronary syndrome.  I think this was myocardial injury in the setting of underlying PE. -Discussed with she and her family that elevated troponin does increase worsened prognosis from a cardiovascular perspective. - Convert to oral anticoagulant when able. -Normal ejection fraction reassuring.  Pacemaker -[employer] functioning well.  Dr. Curt Bears.  Please let us know if we can be of further assistance.  No further cardiac testing.   [facility] will sign off.   We will see back in office as previously scheduled  For  questions or updates, please contact [place] Please consult [email or web address] for contact info under        Signed, Candee Furbish, MD  04/03/2018, 1:13 PM

## 2018-04-03 NOTE — Progress Notes (Signed)
[facility] TEAM 1 - Stepdown/ICU TEAM  Gloria Gonzalez  XBL:[id number] DOB: [birth year] DOA: 04/02/2018 PCP: Hoyt Koch, MD    Brief Narrative:  85 y.o. F w/ a hx of CHB s/p PPM and HLD who presented with generalized weakness worsening over 7 days, with the acute onset of central chest pressure and SOB on the day of her presentation.   In the ED CTa chest noted small B PEs w/ possible multifocal infiltrates.  Significant Events: 1/5 admit   Subjective: Pt is seen for a f/u visit.  At time of f/u visit, she reports the new development of word finding difficulty. She is able to communicate well, but does require long pauses to think of the word she is trying to find. This is new per her report, as of the last hour. She denies any other neuro deficits. There is no facial droop on exam, or any obvious CN deficiency. She displays 4+/5 strength equally in B U&LE.   Assessment & Plan:  Acute small PE  Acute aphasia  Stat CT head w/o contrast to r/o ICH on heparin gtt   Multifocal pulmonary infiltrates  RVP and Flu screen negative   CHB S/p Pacemaker - device change August 2019  Hypothyroidism   HLD  DVT prophylaxis: IV heparin  Code Status: FULL CODE Family Communication:  Disposition Plan:   Consultants:  Cardiology   Antimicrobials:  Rocephin 1/4 > Azithromycin 1/4 >  Objective: Blood pressure (!) 110/46, pulse 79, temperature 98.2 F (36.8 C), temperature source Oral, resp. rate 15, height 4\' 10"  (1.473 m), weight 49.1 kg, SpO2 94 %.  Intake/Output Summary (Last 24 hours) at 04/03/2018 1522 Last data filed at 04/03/2018 1000 Gross per 24 hour  Intake 397.76 ml  Output 120 ml  Net 277.76 ml   Filed Weights   04/02/18 1818 04/03/18 0436  Weight: 47.2 kg 49.1 kg    Examination: Pt was seen for a f/u visit.    CBC: Recent Labs  Lab 04/02/18 1855 04/03/18 0712  WBC 15.2* 16.5*  NEUTROABS 10.3*  --   HGB 12.6 11.7*  HCT 39.5 35.3*  MCV 96.1 96.2    PLT 431* 300*   Basic Metabolic Panel: Recent Labs  Lab 04/02/18 1855 04/03/18 0712  NA 131* 136  K 4.4 4.3  CL 99 102  CO2 25 24  GLUCOSE 116* 109*  BUN 41* 38*  CREATININE 1.33* 1.41*  CALCIUM 9.1 8.5*   GFR: Estimated Creatinine Clearance: 17 mL/min (A) (by C-G formula based on SCr of 1.41 mg/dL (H)).  Liver Function Tests: Recent Labs  Lab 04/02/18 1855  AST 32  ALT 20  ALKPHOS 63  BILITOT 0.5  PROT 7.7  ALBUMIN 2.9*    Cardiac Enzymes: Recent Labs  Lab 04/03/18 0007 04/03/18 0712 04/03/18 1346  TROPONINI 1.08* 1.08* 0.80*    Recent Results (from the past 240 hour(s))  Blood Culture (routine x 2)     Status: None (Preliminary result)   Collection Time: 04/02/18  8:36 PM  Result Value Ref Range Status   Specimen Description BLOOD RIGHT ANTECUBITAL  Final   Special Requests   Final    BOTTLES DRAWN AEROBIC AND ANAEROBIC Blood Culture results may not be optimal due to an excessive volume of blood received in culture bottles   Culture   Final    NO GROWTH < 24 HOURS Performed at [facility] [address], [place], [place] [address]    Report Status PENDING  Incomplete  Blood Culture (routine x 2)     Status: None (Preliminary result)   Collection Time: 04/02/18  8:41 PM  Result Value Ref Range Status   Specimen Description BLOOD LEFT ANTECUBITAL  Final   Special Requests   Final    BOTTLES DRAWN AEROBIC AND ANAEROBIC Blood Culture adequate volume   Culture   Final    NO GROWTH < 24 HOURS Performed at [facility], [place] [address], [place], [place] [address]    Report Status PENDING  Incomplete  Respiratory Panel by PCR     Status: None   Collection Time: 04/03/18  1:01 AM  Result Value Ref Range Status   Adenovirus NOT DETECTED NOT DETECTED Final   Coronavirus 229E NOT DETECTED NOT DETECTED Final   Coronavirus HKU1 NOT DETECTED NOT DETECTED Final   Coronavirus NL63 NOT DETECTED NOT DETECTED Final   Coronavirus OC43 NOT DETECTED NOT  DETECTED Final   Metapneumovirus NOT DETECTED NOT DETECTED Final   Rhinovirus / Enterovirus NOT DETECTED NOT DETECTED Final   Influenza A NOT DETECTED NOT DETECTED Final   Influenza B NOT DETECTED NOT DETECTED Final   Parainfluenza Virus 1 NOT DETECTED NOT DETECTED Final   Parainfluenza Virus 2 NOT DETECTED NOT DETECTED Final   Parainfluenza Virus 3 NOT DETECTED NOT DETECTED Final   Parainfluenza Virus 4 NOT DETECTED NOT DETECTED Final   Respiratory Syncytial Virus NOT DETECTED NOT DETECTED Final   Bordetella pertussis NOT DETECTED NOT DETECTED Final   Chlamydophila pneumoniae NOT DETECTED NOT DETECTED Final   Mycoplasma pneumoniae NOT DETECTED NOT DETECTED Final     Scheduled Meds: . aspirin  325 mg Oral Daily   Continuous Infusions: . azithromycin Stopped (04/02/18 2144)  . cefTRIAXone (ROCEPHIN)  IV Stopped (04/02/18 2117)  . heparin 800 Units/hr (04/03/18 0836)     LOS: 0 days   Time spent: No Charge  Cherene Altes, MD [facility] Office  [phone] Pager - Text Page per Amion as per below:  On-Call/Text Page:      [email or web address]  If 7PM-7AM, please contact night-coverage [email or web address] 04/03/2018, 3:22 PM

## 2018-04-03 NOTE — H&P (Signed)
History and Physical    Gloria Gonzalez ZOX:[id number] DOB: May 14, 1921 DOA: 04/02/2018  PCP: Hoyt Koch, MD  Patient coming from: Home  I have personally briefly reviewed patient's old medical records in [place]  Chief Complaint: CP, SOB  HPI: Gloria Gonzalez is a 85 y.o. female with medical history significant of CHB s/p PPM, HLD.  Patient presents to the ED with c/o generalized weakness, worsening over past 7 days.  Today developed acute onset central chest pressure and SOB.  Symptoms resolved at this time.   ED Course: Trop 0.94, CT scan shows small B PEs and what the radiologist thinks is multifocal PNA favored over pulm edema.  Patient denies fever, no cough that's worse than baseline (she says she has chronic cough).  Others in household have URI symptoms.  Started on empiric rocephin and azithro by EDP.  Cards consulted.   Review of Systems: As per HPI otherwise 10 point review of systems negative.   Past Medical History:  Diagnosis Date  . Arrhythmia   . Arthritis   . Complete heart block ([place])   . Glaucoma   . Hyperlipidemia   . Thyroid disease     Past Surgical History:  Procedure Laterality Date  . EYE SURGERY    . PACEMAKER INSERTION  2003  . PPM GENERATOR CHANGEOUT N/A 11/26/2016   Procedure: PPM GENERATOR CHANGEOUT;  Surgeon: Constance Haw, MD;  Location: [facility];  Service: Cardiovascular;  Laterality: N/A;  . PPM GENERATOR CHANGEOUT N/A 11/15/2017   Procedure: PPM GENERATOR CHANGEOUT;  Surgeon: Constance Haw, MD;  Location: [facility];  Service: Cardiovascular;  Laterality: N/A;     reports that she has never smoked. She has never used smokeless tobacco. She reports that she does not drink alcohol or use drugs.  No Known Allergies  Family History  Problem Relation Age of Onset  . Arthritis Mother   . Heart disease Sister   . Heart disease Brother      Prior to Admission medications     Medication Sig Start Date End Date Taking? Authorizing Provider  atorvastatin (LIPITOR) 20 MG tablet TAKE 1 TABLET BY MOUTH DAILY AT 6 PM Patient taking differently: Take 20 mg by mouth daily.  08/10/17  Yes Hoyt Koch, MD  Calcium Citrate-Vitamin D (CALCIUM + D PO) Take 1 tablet by mouth 2 (two) times daily.    Yes [provider]  diclofenac sodium (VOLTAREN) 1 % GEL Apply 2 g topically 4 (four) times daily. Patient taking differently: Apply 2 g topically 4 (four) times daily as needed (pain).  02/01/18  Yes Hoyt Koch, MD  Glucosamine-Chondroitin (COSAMIN DS PO) Take 1 tablet by mouth 2 (two) times daily.   Yes [provider]  levothyroxine (SYNTHROID, LEVOTHROID) 75 MCG tablet Take 1 tablet (75 mcg total) by mouth daily. Patient taking differently: Take 75 mcg by mouth daily before breakfast.  06/21/17  Yes Hoyt Koch, MD  Multiple Vitamins-Minerals (PRESERVISION AREDS 2) CAPS Take 1 capsule by mouth 2 (two) times daily.   Yes [provider]  omega-3 acid ethyl esters (LOVAZA) 1 g capsule TAKE 1 CAPSULE BY MOUTH TWICE DAILY Patient taking differently: Take 1 g by mouth 2 (two) times daily.  07/20/17  Yes Hoyt Koch, MD  raloxifene (EVISTA) 60 MG tablet TAKE 1 TABLET BY MOUTH DAILY Patient taking differently: Take 60 mg by mouth daily.  08/10/17  Yes Hoyt Koch, MD  triamcinolone cream (KENALOG) 0.1 % Apply 1 application topically 2 (two) times daily as needed (irritation).   Yes [provider]    Physical Exam: Vitals:   04/02/18 2230 04/02/18 2300 04/02/18 2330 04/03/18 0000  BP: (!) 129/29 (!) 115/53 (!) 110/42 (!) 100/43  Pulse:  89  91  Resp: (!) 23 18 (!) 37 (!) 23  Temp:      TempSrc:      SpO2:  98%  94%  Weight:      Height:        Constitutional: NAD, calm, comfortable Eyes: PERRL, lids and conjunctivae normal ENMT: Mucous membranes are moist. Posterior pharynx clear of any exudate  or lesions.Normal dentition.  Neck: normal, supple, no masses, no thyromegaly Respiratory: clear to auscultation bilaterally, no wheezing, no crackles. Normal respiratory effort. No accessory muscle use.  Cardiovascular: Regular rate and rhythm, no murmurs / rubs / gallops. No extremity edema. 2+ pedal pulses. No carotid bruits.  Abdomen: no tenderness, no masses palpated. No hepatosplenomegaly. Bowel sounds positive.  Musculoskeletal: no clubbing / cyanosis. No joint deformity upper and lower extremities. Good ROM, no contractures. Normal muscle tone.  Skin: no rashes, lesions, ulcers. No induration Neurologic: CN 2-12 grossly intact. Sensation intact, DTR normal. Strength 5/5 in all 4.  Psychiatric: Normal judgment and insight. Alert and oriented x 3. Normal mood.    Labs on Admission: I have personally reviewed following labs and imaging studies  CBC: Recent Labs  Lab 04/02/18 1855  WBC 15.2*  NEUTROABS 10.3*  HGB 12.6  HCT 39.5  MCV 96.1  PLT 427*   Basic Metabolic Panel: Recent Labs  Lab 04/02/18 1855  NA 131*  K 4.4  CL 99  CO2 25  GLUCOSE 116*  BUN 41*  CREATININE 1.33*  CALCIUM 9.1   GFR: Estimated Creatinine Clearance: 17.6 mL/min (A) (by C-G formula based on SCr of 1.33 mg/dL (H)). Liver Function Tests: Recent Labs  Lab 04/02/18 1855  AST 32  ALT 20  ALKPHOS 63  BILITOT 0.5  PROT 7.7  ALBUMIN 2.9*   No results for input(s): LIPASE, AMYLASE in the last 168 hours. No results for input(s): AMMONIA in the last 168 hours. Coagulation Profile: No results for input(s): INR, PROTIME in the last 168 hours. Cardiac Enzymes: No results for input(s): CKTOTAL, CKMB, CKMBINDEX, TROPONINI in the last 168 hours. BNP (last 3 results) No results for input(s): PROBNP in the last 8760 hours. HbA1C: No results for input(s): HGBA1C in the last 72 hours. CBG: No results for input(s): GLUCAP in the last 168 hours. Lipid Profile: No results for input(s): CHOL, HDL,  LDLCALC, TRIG, CHOLHDL, LDLDIRECT in the last 72 hours. Thyroid Function Tests: No results for input(s): TSH, T4TOTAL, FREET4, T3FREE, THYROIDAB in the last 72 hours. Anemia Panel: No results for input(s): VITAMINB12, FOLATE, FERRITIN, TIBC, IRON, RETICCTPCT in the last 72 hours. Urine analysis:    Component Value Date/Time   BILIRUBINUR negative 06/13/2016 0936   PROTEINUR negative 06/13/2016 0936   UROBILINOGEN 0.2 06/13/2016 0936   NITRITE negative 06/13/2016 0936   LEUKOCYTESUR Negative 06/13/2016 0936    Radiological Exams on Admission: Dg Chest 2 View  Result Date: 04/02/2018 CLINICAL DATA:  Chest pressure, chest pain EXAM: CHEST - 2 VIEW COMPARISON:  06/21/2017 FINDINGS: LEFT subclavian pacemaker with leads projecting at RIGHT atrium and RIGHT ventricle unchanged. Upper normal heart size. Atherosclerotic calcification of thoracic aorta. Mediastinal contours normal. Diffuse BILATERAL pulmonary infiltrates question edema versus infection. Underlying chronic bronchitic changes.  No definite pleural effusion or pneumothorax. Bones diffusely demineralized. IMPRESSION: Diffuse BILATERAL pulmonary infiltrates question pulmonary edema versus infection. Electronically Signed   By: Lavonia Dana M.D.   On: 04/02/2018 20:00   Ct Angio Chest Pe W And/or Wo Contrast  Result Date: 04/02/2018 CLINICAL DATA:  Chest pressure, shortness of breath, elevated D-dimer, history of heart block EXAM: CT ANGIOGRAPHY CHEST WITH CONTRAST TECHNIQUE: Multidetector CT imaging of the chest was performed using the standard protocol during bolus administration of intravenous contrast. Multiplanar CT image reconstructions and MIPs were obtained to evaluate the vascular anatomy. CONTRAST:  70mL ISOVUE-370 IOPAMIDOL (ISOVUE-370) INJECTION 76% IV COMPARISON:  None. FINDINGS: Cardiovascular: Extensive atherosclerotic calcifications of the thoracic aorta, coronary arteries and proximal great vessels. Aorta normal caliber. Pulmonary  arteries adequately opacified. Small filling defects are identified within RIGHT upper lobe and RIGHT lower lobe pulmonary arteries consistent with small pulmonary emboli. No large or central pulmonary emboli are identified. Mediastinum/Nodes: Esophagus unremarkable. Base of cervical region normal appearance. No definite thoracic adenopathy. Few scattered normal size mediastinal lymph nodes. Upper normal sized 10 mm azygo-esophageal recess node. Lungs/Pleura: Extensive patchy airspace infiltrates throughout BILATERAL upper lobes, RIGHT middle lobe, minimally in lower lobes, favor multifocal pneumonia over edema. Minimal pleural effusions bilaterally. No pneumothorax or definite pulmonary mass. Upper Abdomen: Probable tiny nonobstructing calculus at upper pole LEFT kidney. Remaining visualized upper abdomen unremarkable. Musculoskeletal: Osseous demineralization with multiple thoracic spine compression deformities. Review of the MIP images confirms the above findings. IMPRESSION: Small pulmonary emboli identified within RIGHT upper lobe and RIGHT lower lobe pulmonary arterial branches. Extensive atherosclerotic calcifications of aorta, coronary arteries and proximal great vessels. BILATERAL patchy pulmonary infiltrates favor multifocal pneumonia over edema. Osseous demineralization with multiple thoracic spine compression fractures. Aortic Atherosclerosis (ICD10-I70.0). Critical Value/emergent results were called by telephone at the time of interpretation on 04/02/2018 at 10:33 pm to Dr. Nanda Quinton , who verbally acknowledged these results. Electronically Signed   By: Lavonia Dana M.D.   On: 04/02/2018 22:34    EKG: Independently reviewed.  Assessment/Plan Principal Problem:   Acute pulmonary embolism with acute cor pulmonale (HCC) Active Problems:   Infiltrate noted on imaging study   Elevated troponin    1. Acute PE - 1. Heparin GTT 2. 2d echo 3. BLE venous duplex 2. Infiltrate on CT scan - 1. Pulm  edema secondary to ACS or cor pulmonale vs infectious multifocal PNA 2. Continue Rocephin and azithromycin empirically for now 3. Influenza neg, checking RVP 4. BCx pending 5. Got 20mg  lasix IV x1 in ED 6. Repeat CBC/BMP in AM 7. Checking pro calcitonin 3. Elevated troponin - 1. R Heart strain from PE vs ACS vs demand ischemia 2. Cards consult 3. Heparin gtt 4. ASA 325 daily for now 5. Serial trops 6. Tele monitor 7. 2d echo as above  DVT prophylaxis: Heparin gtt Code Status: Full Family Communication: No family in room Disposition Plan: Home after admit Consults called: Cards Admission status: Admit to inpatient  Severity of Illness: The appropriate patient status for this patient is INPATIENT. Inpatient status is judged to be reasonable and necessary in order to provide the required intensity of service to ensure the patient's safety. [name] presenting symptoms, physical exam findings, and initial radiographic and laboratory data in the context of their chronic comorbidities is felt to place them at high risk for further clinical deterioration. Furthermore, it is not anticipated that the patient will be medically stable for discharge from the hospital within 2 midnights of  admission. The following factors support the patient status of inpatient.   " [name] presenting symptoms include chest pressure, SOB. " The initial radiographic and laboratory data are worrisome because of PE and infiltrates on CT scan, positive troponin.   * I certify that at the point of admission it is my clinical judgment that the patient will require inpatient hospital care spanning beyond 2 midnights from the point of admission due to high intensity of service, high risk for further deterioration and high frequency of surveillance required.Etta Quill DO [facility] Pager [phone] Only works nights!  If 7AM-7PM, please contact the primary day team physician taking care  of patient  [email or web address] Password [facility]  04/03/2018, 12:54 AM

## 2018-04-03 NOTE — Progress Notes (Signed)
ANTICOAGULATION CONSULT NOTE - Initial Consult  Pharmacy Consult for Heparin  Indication: pulmonary embolus  No Known Allergies  Patient Measurements: Height: 4\' 10"  (147.3 cm) Weight: 108 lb 3.9 oz (49.1 kg) IBW/kg (Calculated) : 40.9 Dosing weight 49.1   Vital Signs: Temp: 98.6 F (37 C) (01/05 0436) Temp Source: Oral (01/05 0436) BP: 135/53 (01/05 0436) Pulse Rate: 89 (01/05 0436)  Labs: Recent Labs    04/02/18 1855 04/03/18 0007 04/03/18 0712  HGB 12.6  --   --   HCT 39.5  --   --   PLT 431*  --   --   HEPARINUNFRC  --   --  0.25*  CREATININE 1.33*  --   --   TROPONINI  --  1.08*  --     Estimated Creatinine Clearance: 18 mL/min (A) (by C-G formula based on SCr of 1.33 mg/dL (H)).   Medical History: Past Medical History:  Diagnosis Date  . Arrhythmia   . Arthritis   . Complete heart block ([place])   . Glaucoma   . Hyperlipidemia   . Thyroid disease     Assessment: 85 y/o F with CT angio confirmation of PE, starting heparin, CBC ok, mild bump in Scr, pta meds reviewed.   Initial heparin level below goal at 0.25 units/mL, level drawn ~1hr early.    Goal of Therapy:  Heparin level 0.3-0.7 units/ml Monitor platelets by anticoagulation protocol: Yes   Plan:  Heparin 700 units bolus Increase heparin drip to 800 units/hr 1630 heparin level Daily CBC/HL Monitor for bleeding  Harrietta Guardian, PharmD PGY1 Pharmacy Resident 04/03/2018    8:18 AM Please check AMION for all [place] numbers

## 2018-04-03 NOTE — Progress Notes (Signed)
Patient's experienced shortness of breath while eating which resolved while at rest.  Supplemental oxygen was increased from 4L [place] to 5L [place].  Attending physician, Dr. Thereasa Solo, MD notified and made aware.

## 2018-04-03 NOTE — Progress Notes (Signed)
ANTICOAGULATION CONSULT NOTE  Pharmacy Consult for Heparin  Indication: pulmonary embolus  No Known Allergies  Patient Measurements: Height: 4\' 10"  (147.3 cm) Weight: 108 lb 3.9 oz (49.1 kg) IBW/kg (Calculated) : 40.9 Dosing weight 49.1   Vital Signs: Temp: 99.4 F (37.4 C) (01/05 1646) Temp Source: Oral (01/05 1646) BP: 123/53 (01/05 1646) Pulse Rate: 91 (01/05 1646)  Labs: Recent Labs    04/02/18 1855 04/03/18 0007 04/03/18 0712 04/03/18 1346 04/03/18 1546  HGB 12.6  --  11.7*  --   --   HCT 39.5  --  35.3*  --   --   PLT 431*  --  459*  --   --   HEPARINUNFRC  --   --  0.25*  --  0.59  CREATININE 1.33*  --  1.41*  --   --   TROPONINI  --  1.08* 1.08* 0.80*  --     Estimated Creatinine Clearance: 17 mL/min (A) (by C-G formula based on SCr of 1.41 mg/dL (H)).   Medical History: Past Medical History:  Diagnosis Date  . Arrhythmia   . Arthritis   . Complete heart block ([place])   . Glaucoma   . Hyperlipidemia   . Thyroid disease     Assessment: 85 y/o F with CT angio confirmation of PE on heparin  -heparin level at goal on 800 units/hr   Goal of Therapy:  Heparin level 0.3-0.7 units/ml Monitor platelets by anticoagulation protocol: Yes   Plan:  -No heparin changes needed -Daily heparin level and CBC  Hildred Laser, PharmD Clinical Pharmacist **Pharmacist phone directory can now be found on [email or web address] ([email or web address]).  Listed under [place].

## 2018-04-03 NOTE — Progress Notes (Signed)
*  PRELIMINARY RESULTS* Echocardiogram 2D Echocardiogram has been performed.  Leavy Cella 04/03/2018, 12:45 PM

## 2018-04-03 NOTE — Progress Notes (Signed)
Pt off the floor from 11 am- 1 pm for 2D echocardiogram and peripheral vascular study.  Vital signs obtained upon patient return to the unit.

## 2018-04-04 ENCOUNTER — Inpatient Hospital Stay (HOSPITAL_COMMUNITY): Payer: Medicare Other

## 2018-04-04 DIAGNOSIS — I248 Other forms of acute ischemic heart disease: Secondary | ICD-10-CM | POA: Diagnosis not present

## 2018-04-04 DIAGNOSIS — I2609 Other pulmonary embolism with acute cor pulmonale: Secondary | ICD-10-CM | POA: Diagnosis not present

## 2018-04-04 DIAGNOSIS — R7989 Other specified abnormal findings of blood chemistry: Secondary | ICD-10-CM | POA: Diagnosis not present

## 2018-04-04 DIAGNOSIS — I2782 Chronic pulmonary embolism: Secondary | ICD-10-CM | POA: Diagnosis not present

## 2018-04-04 DIAGNOSIS — R072 Precordial pain: Secondary | ICD-10-CM | POA: Diagnosis not present

## 2018-04-04 DIAGNOSIS — J189 Pneumonia, unspecified organism: Secondary | ICD-10-CM | POA: Diagnosis not present

## 2018-04-04 LAB — CBC
HCT: 34.2 % — ABNORMAL LOW (ref 36.0–46.0)
Hemoglobin: 10.8 g/dL — ABNORMAL LOW (ref 12.0–15.0)
MCH: 30.7 pg (ref 26.0–34.0)
MCHC: 31.6 g/dL (ref 30.0–36.0)
MCV: 97.2 fL (ref 80.0–100.0)
Platelets: 437 10*3/uL — ABNORMAL HIGH (ref 150–400)
RBC: 3.52 MIL/uL — ABNORMAL LOW (ref 3.87–5.11)
RDW: 13.1 % (ref 11.5–15.5)
WBC: 16.6 10*3/uL — ABNORMAL HIGH (ref 4.0–10.5)
nRBC: 0 % (ref 0.0–0.2)

## 2018-04-04 LAB — COMPREHENSIVE METABOLIC PANEL
ALT: 28 U/L (ref 0–44)
AST: 37 U/L (ref 15–41)
Albumin: 2.3 g/dL — ABNORMAL LOW (ref 3.5–5.0)
Alkaline Phosphatase: 67 U/L (ref 38–126)
Anion gap: 9 (ref 5–15)
BUN: 33 mg/dL — AB (ref 8–23)
CO2: 21 mmol/L — ABNORMAL LOW (ref 22–32)
Calcium: 7.9 mg/dL — ABNORMAL LOW (ref 8.9–10.3)
Chloride: 106 mmol/L (ref 98–111)
Creatinine, Ser: 1.24 mg/dL — ABNORMAL HIGH (ref 0.44–1.00)
GFR calc Af Amer: 43 mL/min — ABNORMAL LOW (ref >60)
GFR, EST NON AFRICAN AMERICAN: 37 mL/min — AB (ref >60)
Glucose, Bld: 137 mg/dL — ABNORMAL HIGH (ref 70–99)
Potassium: 4 mmol/L (ref 3.5–5.1)
Sodium: 136 mmol/L (ref 135–145)
Total Bilirubin: 0.4 mg/dL (ref 0.3–1.2)
Total Protein: 6.4 g/dL — ABNORMAL LOW (ref 6.5–8.1)

## 2018-04-04 LAB — PROCALCITONIN: Procalcitonin: 0.1 ng/mL

## 2018-04-04 LAB — MAGNESIUM: MAGNESIUM: 2.1 mg/dL (ref 1.7–2.4)

## 2018-04-04 LAB — MRSA PCR SCREENING: MRSA by PCR: NEGATIVE

## 2018-04-04 LAB — HEPARIN LEVEL (UNFRACTIONATED): Heparin Unfractionated: 0.54 IU/mL (ref 0.30–0.70)

## 2018-04-04 MED ORDER — FUROSEMIDE 10 MG/ML IJ SOLN
80.0000 mg | Freq: Once | INTRAMUSCULAR | Status: AC
  Administered 2018-04-04: 80 mg via INTRAVENOUS
  Filled 2018-04-04: qty 8

## 2018-04-04 MED ORDER — PIPERACILLIN-TAZOBACTAM IN DEX 2-0.25 GM/50ML IV SOLN
2.2500 g | Freq: Four times a day (QID) | INTRAVENOUS | Status: DC
  Administered 2018-04-04 – 2018-04-07 (×12): 2.25 g via INTRAVENOUS
  Filled 2018-04-04 (×13): qty 50

## 2018-04-04 MED ORDER — VANCOMYCIN VARIABLE DOSE PER UNSTABLE RENAL FUNCTION (PHARMACIST DOSING): Status: DC

## 2018-04-04 MED ORDER — MORPHINE SULFATE (PF) 2 MG/ML IV SOLN
1.0000 mg | INTRAVENOUS | Status: DC | PRN
  Administered 2018-04-04 – 2018-04-07 (×8): 2 mg via INTRAVENOUS
  Filled 2018-04-04 (×8): qty 1

## 2018-04-04 MED ORDER — VANCOMYCIN HCL IN DEXTROSE 1-5 GM/200ML-% IV SOLN
1000.0000 mg | Freq: Once | INTRAVENOUS | Status: AC
  Administered 2018-04-04: 1000 mg via INTRAVENOUS
  Filled 2018-04-04: qty 200

## 2018-04-04 MED ORDER — LORAZEPAM 2 MG/ML IJ SOLN
0.5000 mg | INTRAMUSCULAR | Status: DC | PRN
  Administered 2018-04-07 (×2): 1 mg via INTRAVENOUS
  Administered 2018-04-07 – 2018-04-08 (×2): 0.5 mg via INTRAVENOUS
  Administered 2018-04-08: 1 mg via INTRAVENOUS
  Administered 2018-04-08 – 2018-04-09 (×3): 0.5 mg via INTRAVENOUS
  Filled 2018-04-04 (×8): qty 1

## 2018-04-04 MED ORDER — METHYLPREDNISOLONE SODIUM SUCC 40 MG IJ SOLR
20.0000 mg | Freq: Once | INTRAMUSCULAR | Status: AC
  Administered 2018-04-04: 20 mg via INTRAVENOUS
  Filled 2018-04-04: qty 1

## 2018-04-04 MED ORDER — METHYLPREDNISOLONE SODIUM SUCC 125 MG IJ SOLR
60.0000 mg | Freq: Two times a day (BID) | INTRAMUSCULAR | Status: DC
  Administered 2018-04-04 – 2018-04-07 (×6): 60 mg via INTRAVENOUS
  Filled 2018-04-04 (×6): qty 2

## 2018-04-04 MED ORDER — BUDESONIDE 0.25 MG/2ML IN SUSP
0.2500 mg | Freq: Two times a day (BID) | RESPIRATORY_TRACT | Status: DC
  Administered 2018-04-04 – 2018-04-07 (×7): 0.25 mg via RESPIRATORY_TRACT
  Filled 2018-04-04 (×7): qty 2

## 2018-04-04 MED ORDER — ALBUTEROL SULFATE (2.5 MG/3ML) 0.083% IN NEBU
2.5000 mg | INHALATION_SOLUTION | RESPIRATORY_TRACT | Status: DC | PRN
  Administered 2018-04-06 – 2018-04-07 (×3): 2.5 mg via RESPIRATORY_TRACT
  Filled 2018-04-04 (×3): qty 3

## 2018-04-04 MED ORDER — MORPHINE 100MG IN NS 100ML (1MG/ML) PREMIX INFUSION
1.0000 mg/h | INTRAVENOUS | Status: DC
  Administered 2018-04-04: 1 mg/h via INTRAVENOUS
  Filled 2018-04-04: qty 100

## 2018-04-04 MED ORDER — METOPROLOL TARTRATE 5 MG/5ML IV SOLN
5.0000 mg | Freq: Three times a day (TID) | INTRAVENOUS | Status: DC
  Administered 2018-04-04 – 2018-04-05 (×2): 5 mg via INTRAVENOUS
  Administered 2018-04-05: 2.5 mg via INTRAVENOUS
  Filled 2018-04-04 (×2): qty 5

## 2018-04-04 MED ORDER — LEVALBUTEROL HCL 0.63 MG/3ML IN NEBU: 0.6300 mg | INHALATION_SOLUTION | RESPIRATORY_TRACT | Status: DC | PRN

## 2018-04-04 MED ORDER — LORAZEPAM 2 MG/ML IJ SOLN
1.0000 mg | Freq: Once | INTRAMUSCULAR | Status: AC
  Administered 2018-04-04: 1 mg via INTRAVENOUS
  Filled 2018-04-04: qty 1

## 2018-04-04 MED ORDER — IPRATROPIUM-ALBUTEROL 0.5-2.5 (3) MG/3ML IN SOLN
RESPIRATORY_TRACT | Status: AC
  Filled 2018-04-04: qty 3

## 2018-04-04 MED ORDER — IPRATROPIUM-ALBUTEROL 0.5-2.5 (3) MG/3ML IN SOLN
3.0000 mL | Freq: Four times a day (QID) | RESPIRATORY_TRACT | Status: DC
  Administered 2018-04-04 – 2018-04-07 (×14): 3 mL via RESPIRATORY_TRACT
  Filled 2018-04-04 (×13): qty 3

## 2018-04-04 MED ORDER — METHYLPREDNISOLONE SODIUM SUCC 40 MG IJ SOLR
40.0000 mg | Freq: Two times a day (BID) | INTRAMUSCULAR | Status: DC
  Administered 2018-04-04: 40 mg via INTRAVENOUS
  Filled 2018-04-04: qty 1

## 2018-04-04 NOTE — Progress Notes (Signed)
ANTICOAGULATION CONSULT NOTE  Pharmacy Consult for Heparin  Indication: pulmonary embolus  No Known Allergies  Patient Measurements: Height: 4\' 10"  (147.3 cm) Weight: 108 lb 3.9 oz (49.1 kg) IBW/kg (Calculated) : 40.9 Dosing weight 49.1   Vital Signs: Temp: 97.6 F (36.4 C) (01/06 0430) Temp Source: Oral (01/06 0430) BP: 147/126 (01/06 0756) Pulse Rate: 105 (01/06 0820)  Labs: Recent Labs    04/02/18 1855 04/03/18 0007 04/03/18 0712 04/03/18 1346 04/03/18 1546 04/04/18 0417 04/04/18 0823  HGB 12.6  --  11.7*  --   --  10.8*  --   HCT 39.5  --  35.3*  --   --  34.2*  --   PLT 431*  --  459*  --   --  437*  --   HEPARINUNFRC  --   --  0.25*  --  0.59  --  0.54  CREATININE 1.33*  --  1.41*  --   --  1.24*  --   TROPONINI  --  1.08* 1.08* 0.80*  --   --   --     Estimated Creatinine Clearance: 19.4 mL/min (A) (by C-G formula based on SCr of 1.24 mg/dL (H)).   Medical History: Past Medical History:  Diagnosis Date  . Arrhythmia   . Arthritis   . Complete heart block ([place])   . Glaucoma   . Hyperlipidemia   . Thyroid disease     Assessment: 85 y/o F with CT angio confirmation of PE on heparin  -heparin level remains at goal on 800 units/hr   Goal of Therapy:  Heparin level 0.3-0.7 units/ml Monitor platelets by anticoagulation protocol: Yes   Plan:  -No heparin changes needed -Daily heparin level and CBC  Alanda Slim, PharmD, [facility] Clinical Pharmacist Please see AMION for all Pharmacists' Contact Phone Numbers 04/04/2018, 9:22 AM

## 2018-04-04 NOTE — Progress Notes (Signed)
Pt c/o increase SOB O2 sat 84% on 5L [place], HR and BP elevated.  RT called for recommendations, pt placed on HFNC 10L O2 sat 93%. MD paged and made aware. Will continue to monitor.  Amanda Cockayne, RN

## 2018-04-04 NOTE — Progress Notes (Signed)
Pt O2 sat 84% on 10L nonrebreather. Pt increased to 15L, given nebulizer. Pt became increasingly tachypnic and nonresponsive. MD paged, RRT RN called.  1116 gave 2mg  morphine per MD. Pt propped up with pillows. Family at bedside. Will continue to monitor.  Amanda Cockayne, RN

## 2018-04-04 NOTE — Plan of Care (Signed)
  Problem: Education: Goal: Knowledge of General Education information will improve Description: Including pain rating scale, medication(s)/side effects and non-pharmacologic comfort measures Outcome: Not Progressing   

## 2018-04-04 NOTE — Progress Notes (Signed)
Called to patient's bedside because of labored breathing. Patient received 2mg  Morphine with no significant improvement.  Daughter at bedside.  Dr Thereasa Solo at bedside to assess patient.  Repositioned patient and placed pillow under arms for support.   Additional 2mg  Morphine given.  Orders received for morphine gtt. Patient improved/breathing easier after interventions.  RN to call if assistance needed.

## 2018-04-04 NOTE — Progress Notes (Signed)
[facility] TEAM 1 - Stepdown/ICU TEAM  Artemisa Sladek  YPP:[id number] DOB: [birth year] DOA: 04/02/2018 PCP: Hoyt Koch, MD    Brief Narrative:  85 y.o. F w/ a hx of CHB s/p PPM and HLD who presented with generalized weakness worsening over 7 days, with the acute onset of central chest pressure and SOB on the day of her presentation.   In the ED CTa chest noted small B PEs w/ possible multifocal infiltrates.  Significant Events: 1/5 admit   Subjective: The patient is developed severe acute respiratory distress this morning.  She is tachypneic and hypoxic.  She is diaphoretic.  Follow-up chest x-ray has revealed no significant change but impressive diffuse bilateral pulmonary infiltrates.  She denies chest pain or abdominal pain.  Oxygen saturations are currently holding in the low 90s with facemask oxygen support but she is clearly in distress.  Assessment & Plan:  Severe acute hypoxic respiratory failure - bilateral severe community-acquired pneumonia Add steroid therapy -titrate oxygen as indicated -attempt diuresis -add anxiolytic -broaden antibiotic coverage to include unlikely but possible MRSA -respiratory virus panel and flu screen negative  Acute small PE Continue anticoagulation  Acute aphasia  Stat CT head without evidence of intracranial bleeding -unable to effectively evaluate this morning due to severe tachypnea -no other focal neurologic deficits at this time  CHB S/p Pacemaker - device change August 2019  Hypothyroidism   HLD  DVT prophylaxis: IV heparin  Code Status: DNR - NO CODE Family Communication: Spoke with daughter at bedside Disposition Plan: Stepdown unit  Consultants:  Cardiology   Antimicrobials:  Rocephin 1/4 > Azithromycin 1/4 > Vancomycin 1/6 >  Objective: Blood pressure (!) 147/126, pulse (!) 105, temperature 97.6 F (36.4 C), temperature source Oral, resp. rate (!) 31, height 4\' 10"  (1.473 m), weight 49.1 kg, SpO2 95  %.  Intake/Output Summary (Last 24 hours) at 04/04/2018 1019 Last data filed at 04/04/2018 4580 Gross per 24 hour  Intake 1639.37 ml  Output 400 ml  Net 1239.37 ml   Filed Weights   04/02/18 1818 04/03/18 0436  Weight: 47.2 kg 49.1 kg    Examination: General: Severe acute respiratory distress with tachypnea Lungs: Diffuse coarse crackles with poor air movement throughout with expiratory wheezing and prolonged expiratory phase Cardiovascular: Tachycardic but regular with no appreciable murmur or rub Abdomen: Thin, soft, bowel sounds positive, no mass, no rebound Extremities: No significant edema bilateral lower extremities   CBC: Recent Labs  Lab 04/02/18 1855 04/03/18 0712 04/04/18 0417  WBC 15.2* 16.5* 16.6*  NEUTROABS 10.3*  --   --   HGB 12.6 11.7* 10.8*  HCT 39.5 35.3* 34.2*  MCV 96.1 96.2 97.2  PLT 431* 459* 998*   Basic Metabolic Panel: Recent Labs  Lab 04/02/18 1855 04/03/18 0712 04/04/18 0417  NA 131* 136 136  K 4.4 4.3 4.0  CL 99 102 106  CO2 25 24 PENDING  GLUCOSE 116* 109* 137*  BUN 41* 38* 33*  CREATININE 1.33* 1.41* 1.24*  CALCIUM 9.1 8.5* 7.9*  MG  --   --  2.1   GFR: Estimated Creatinine Clearance: 19.4 mL/min (A) (by C-G formula based on SCr of 1.24 mg/dL (H)).  Liver Function Tests: Recent Labs  Lab 04/02/18 1855 04/04/18 0417  AST 32 37  ALT 20 28  ALKPHOS 63 67  BILITOT 0.5 0.4  PROT 7.7 6.4*  ALBUMIN 2.9* 2.3*    Cardiac Enzymes: Recent Labs  Lab 04/03/18 0007 04/03/18 0712 04/03/18 1346  TROPONINI 1.08*  1.08* 0.80*    Recent Results (from the past 240 hour(s))  Blood Culture (routine x 2)     Status: None (Preliminary result)   Collection Time: 04/02/18  8:36 PM  Result Value Ref Range Status   Specimen Description BLOOD RIGHT ANTECUBITAL  Final   Special Requests   Final    BOTTLES DRAWN AEROBIC AND ANAEROBIC Blood Culture results may not be optimal due to an excessive volume of blood received in culture bottles    Culture   Final    NO GROWTH < 24 HOURS Performed at [facility] [address], [place], [place] [address]    Report Status PENDING  Incomplete  Blood Culture (routine x 2)     Status: None (Preliminary result)   Collection Time: 04/02/18  8:41 PM  Result Value Ref Range Status   Specimen Description BLOOD LEFT ANTECUBITAL  Final   Special Requests   Final    BOTTLES DRAWN AEROBIC AND ANAEROBIC Blood Culture adequate volume   Culture   Final    NO GROWTH < 24 HOURS Performed at [facility], [place] [address], [place], [place] [address]    Report Status PENDING  Incomplete  Respiratory Panel by PCR     Status: None   Collection Time: 04/03/18  1:01 AM  Result Value Ref Range Status   Adenovirus NOT DETECTED NOT DETECTED Final   Coronavirus 229E NOT DETECTED NOT DETECTED Final   Coronavirus HKU1 NOT DETECTED NOT DETECTED Final   Coronavirus NL63 NOT DETECTED NOT DETECTED Final   Coronavirus OC43 NOT DETECTED NOT DETECTED Final   Metapneumovirus NOT DETECTED NOT DETECTED Final   Rhinovirus / Enterovirus NOT DETECTED NOT DETECTED Final   Influenza A NOT DETECTED NOT DETECTED Final   Influenza B NOT DETECTED NOT DETECTED Final   Parainfluenza Virus 1 NOT DETECTED NOT DETECTED Final   Parainfluenza Virus 2 NOT DETECTED NOT DETECTED Final   Parainfluenza Virus 3 NOT DETECTED NOT DETECTED Final   Parainfluenza Virus 4 NOT DETECTED NOT DETECTED Final   Respiratory Syncytial Virus NOT DETECTED NOT DETECTED Final   Bordetella pertussis NOT DETECTED NOT DETECTED Final   Chlamydophila pneumoniae NOT DETECTED NOT DETECTED Final   Mycoplasma pneumoniae NOT DETECTED NOT DETECTED Final     Scheduled Meds: . furosemide  80 mg Intravenous Once  . ipratropium-albuterol  3 mL Nebulization Q6H  . levothyroxine  75 mcg Oral QAC breakfast  . LORazepam  1 mg Intravenous Once  . methylPREDNISolone (SOLU-MEDROL) injection  60 mg Intravenous Q12H  . methylPREDNISolone (SOLU-MEDROL)  injection  20 mg Intravenous Once  . metoprolol tartrate  5 mg Intravenous Q8H  . vancomycin variable dose per unstable renal function (pharmacist dosing)   Does not apply See admin instructions   Continuous Infusions: . sodium chloride 10 mL/hr at 04/04/18 0901  . azithromycin 500 mg (04/03/18 2050)  . cefTRIAXone (ROCEPHIN)  IV 2 g (04/03/18 2010)  . heparin 800 Units/hr (04/04/18 0981)  . vancomycin       LOS: 1 day    Cherene Altes, MD [facility] Office  [phone] Pager - Text Page per Amion as per below:  On-Call/Text Page:      [email or web address]  If 7PM-7AM, please contact night-coverage [email or web address] 04/04/2018, 10:19 AM

## 2018-04-04 NOTE — Progress Notes (Addendum)
Pharmacy Antibiotic Note  Gloria Gonzalez is a 85 y.o. female admitted on 04/02/2018 with aspiration pneumonia.  Pharmacy has been consulted for Vancomycin and Zosyn dosing.  Plan: Vancomycin 1 gram IV x 1 today, then pharmacy will redose as needed due to renal dysfunction. Zosyn 2.25 gm IV q6hr Will monitor renal fxn, C&S, and vanc levels as appropriate.  Height: 4\' 10"  (147.3 cm) Weight: 108 lb 3.9 oz (49.1 kg) IBW/kg (Calculated) : 40.9  Temp (24hrs), Avg:98.4 F (36.9 C), Min:97.6 F (36.4 C), Max:99.4 F (37.4 C)  Recent Labs  Lab 04/02/18 1855 04/02/18 2053 04/03/18 0712 04/04/18 0417  WBC 15.2*  --  16.5* 16.6*  CREATININE 1.33*  --  1.41* 1.24*  LATICACIDVEN  --  1.07  --   --     Estimated Creatinine Clearance: 19.4 mL/min (A) (by C-G formula based on SCr of 1.24 mg/dL (H)).    No Known Allergies  Antimicrobials this admission: Vanc 01/06 >>  Zosyn 01/06 >> Azithro 01/04 >>  Rocephin 01/04 >> 01/06  Microbiology results: 01/04 BCx: pending  Thank you for allowing pharmacy to be a part of this patient's care.  Alanda Slim, PharmD, [facility] Clinical Pharmacist Please see AMION for all Pharmacists' Contact Phone Numbers 04/04/2018, 8:50 AM

## 2018-04-04 NOTE — Progress Notes (Signed)
RT called to patient's room D/T [facility] and SpO2 85%. Repositioned pateint in bed and changed to HFNC 10lpm. Patient now resting well with charted vitals.

## 2018-04-05 ENCOUNTER — Inpatient Hospital Stay (HOSPITAL_COMMUNITY): Payer: Medicare Other

## 2018-04-05 DIAGNOSIS — I2609 Other pulmonary embolism with acute cor pulmonale: Secondary | ICD-10-CM | POA: Diagnosis not present

## 2018-04-05 DIAGNOSIS — R072 Precordial pain: Secondary | ICD-10-CM | POA: Diagnosis not present

## 2018-04-05 DIAGNOSIS — J189 Pneumonia, unspecified organism: Secondary | ICD-10-CM | POA: Diagnosis not present

## 2018-04-05 DIAGNOSIS — I248 Other forms of acute ischemic heart disease: Secondary | ICD-10-CM | POA: Diagnosis not present

## 2018-04-05 DIAGNOSIS — R7989 Other specified abnormal findings of blood chemistry: Secondary | ICD-10-CM | POA: Diagnosis not present

## 2018-04-05 DIAGNOSIS — I2782 Chronic pulmonary embolism: Secondary | ICD-10-CM | POA: Diagnosis not present

## 2018-04-05 LAB — CBC
HCT: 31.2 % — ABNORMAL LOW (ref 36.0–46.0)
Hemoglobin: 9.9 g/dL — ABNORMAL LOW (ref 12.0–15.0)
MCH: 30.9 pg (ref 26.0–34.0)
MCHC: 31.7 g/dL (ref 30.0–36.0)
MCV: 97.5 fL (ref 80.0–100.0)
Platelets: 396 10*3/uL (ref 150–400)
RBC: 3.2 MIL/uL — ABNORMAL LOW (ref 3.87–5.11)
RDW: 13.1 % (ref 11.5–15.5)
WBC: 14.9 10*3/uL — ABNORMAL HIGH (ref 4.0–10.5)
nRBC: 0 % (ref 0.0–0.2)

## 2018-04-05 LAB — COMPREHENSIVE METABOLIC PANEL
ALT: 29 U/L (ref 0–44)
AST: 31 U/L (ref 15–41)
Albumin: 2.1 g/dL — ABNORMAL LOW (ref 3.5–5.0)
Alkaline Phosphatase: 65 U/L (ref 38–126)
Anion gap: 11 (ref 5–15)
BILIRUBIN TOTAL: 0.7 mg/dL (ref 0.3–1.2)
BUN: 40 mg/dL — ABNORMAL HIGH (ref 8–23)
CO2: 22 mmol/L (ref 22–32)
Calcium: 8 mg/dL — ABNORMAL LOW (ref 8.9–10.3)
Chloride: 105 mmol/L (ref 98–111)
Creatinine, Ser: 1.71 mg/dL — ABNORMAL HIGH (ref 0.44–1.00)
GFR calc Af Amer: 29 mL/min — ABNORMAL LOW (ref >60)
GFR calc non Af Amer: 25 mL/min — ABNORMAL LOW (ref >60)
GLUCOSE: 182 mg/dL — AB (ref 70–99)
Potassium: 4.2 mmol/L (ref 3.5–5.1)
Sodium: 138 mmol/L (ref 135–145)
Total Protein: 6.5 g/dL (ref 6.5–8.1)

## 2018-04-05 LAB — HEPARIN LEVEL (UNFRACTIONATED)
Heparin Unfractionated: 0.95 IU/mL — ABNORMAL HIGH (ref 0.30–0.70)
Heparin Unfractionated: 0.93 IU/mL — ABNORMAL HIGH (ref 0.30–0.70)

## 2018-04-05 LAB — PROCALCITONIN: Procalcitonin: 0.86 ng/mL

## 2018-04-05 LAB — VANCOMYCIN, RANDOM: Vancomycin Rm: 15

## 2018-04-05 MED ORDER — VANCOMYCIN HCL IN DEXTROSE 750-5 MG/150ML-% IV SOLN
750.0000 mg | Freq: Once | INTRAVENOUS | Status: AC
  Administered 2018-04-05: 750 mg via INTRAVENOUS
  Filled 2018-04-05: qty 150

## 2018-04-05 MED ORDER — METOPROLOL TARTRATE 5 MG/5ML IV SOLN
2.5000 mg | Freq: Three times a day (TID) | INTRAVENOUS | Status: DC
  Administered 2018-04-05 – 2018-04-07 (×6): 2.5 mg via INTRAVENOUS
  Filled 2018-04-05 (×7): qty 5

## 2018-04-05 NOTE — Plan of Care (Signed)
  Problem: Respiratory: Goal: Ability to maintain adequate ventilation will improve Outcome: Progressing   Problem: Respiratory: Goal: Ability to maintain a clear airway will improve Outcome: Progressing   Problem: Education: Goal: Knowledge of General Education information will improve Description: Including pain rating scale, medication(s)/side effects and non-pharmacologic comfort measures Outcome: Progressing   

## 2018-04-05 NOTE — Progress Notes (Signed)
Pharmacy Antibiotic Note  Gloria Gonzalez is a 85 y.o. female admitted on 04/02/2018 with aspiration pneumonia.  Pharmacy has been consulted for Vancomycin dosing.  Vancomycin random level this am was 15 mcg/ml.  This is therapeutic for pneumonia.  Plan: Vancomycin 750 mg IV x 1 today, then pharmacy will redose as needed due to renal dysfunction. Will monitor renal fxn, C&S, and vanc levels as appropriate.  Height: 4\' 10"  (147.3 cm) Weight: 108 lb 3.9 oz (49.1 kg) IBW/kg (Calculated) : 40.9  Temp (24hrs), Avg:97.2 F (36.2 C), Min:97 F (36.1 C), Max:97.3 F (36.3 C)  Recent Labs  Lab 04/02/18 1855 04/02/18 2053 04/03/18 0712 04/04/18 0417 04/05/18 0306  WBC 15.2*  --  16.5* 16.6* 14.9*  CREATININE 1.33*  --  1.41* 1.24* 1.71*  LATICACIDVEN  --  1.07  --   --   --   VANCORANDOM  --   --   --   --  15    Estimated Creatinine Clearance: 14 mL/min (A) (by C-G formula based on SCr of 1.71 mg/dL (H)).    No Known Allergies  Antimicrobials this admission: Vanc 01/06 >>  Zosyn 01/06 >> Azithro 01/04 >>  Rocephin 01/04 >> 01/06  Microbiology results: 01/04 BCx: pending  Thank you for allowing pharmacy to be a part of this patient's care.  Alanda Slim, PharmD, [facility] Clinical Pharmacist Please see AMION for all Pharmacists' Contact Phone Numbers 04/05/2018, 7:38 AM

## 2018-04-05 NOTE — Progress Notes (Signed)
ANTICOAGULATION CONSULT NOTE  Pharmacy Consult for Heparin  Indication: pulmonary embolus  No Known Allergies  Patient Measurements: Height: 4\' 10"  (147.3 cm) Weight: 108 lb 3.9 oz (49.1 kg) IBW/kg (Calculated) : 40.9 Dosing weight 49.1   Vital Signs: Temp: 97 F (36.1 C) (01/07 0414) Temp Source: Axillary (01/07 0414) BP: 103/51 (01/07 0414) Pulse Rate: 94 (01/07 0414)  Labs: Recent Labs    04/03/18 0007  04/03/18 0177 04/03/18 1346 04/03/18 1546 04/04/18 0417 04/04/18 0823 04/05/18 0306  HGB  --   --  11.7*  --   --  10.8*  --  9.9*  HCT  --   --  35.3*  --   --  34.2*  --  31.2*  PLT  --   --  459*  --   --  437*  --  396  HEPARINUNFRC  --    < > 0.25*  --  0.59  --  0.54 0.95*  CREATININE  --   --  1.41*  --   --  1.24*  --  1.71*  TROPONINI 1.08*  --  1.08* 0.80*  --   --   --   --    < > = values in this interval not displayed.    Estimated Creatinine Clearance: 14 mL/min (A) (by C-G formula based on SCr of 1.71 mg/dL (H)).   Medical History: Past Medical History:  Diagnosis Date  . Arrhythmia   . Arthritis   . Complete heart block ([place])   . Glaucoma   . Hyperlipidemia   . Thyroid disease     Assessment: 85 y/o F with CT angio confirmation of PE on heparin  -heparin level supratherapeutic on 800 units/hr   Goal of Therapy:  Heparin level 0.3-0.7 units/ml Monitor platelets by anticoagulation protocol: Yes   Plan:  -Decrease heparin infusion to 700 units/hr - Recheck heparin level in 8 hours -Daily heparin level and CBC  Alanda Slim, PharmD, [facility] Clinical Pharmacist Please see AMION for all Pharmacists' Contact Phone Numbers 04/05/2018, 7:41 AM

## 2018-04-05 NOTE — Progress Notes (Signed)
ANTICOAGULATION CONSULT NOTE  Pharmacy Consult for Heparin  Indication: pulmonary embolus  No Known Allergies  Patient Measurements: Height: 4\' 10"  (147.3 cm) Weight: 108 lb 3.9 oz (49.1 kg) IBW/kg (Calculated) : 40.9 Dosing weight 49.1   Vital Signs: Temp: 96.8 F (36 C) (01/07 0754) Temp Source: Axillary (01/07 0754) BP: 100/58 (01/07 1200) Pulse Rate: 89 (01/07 1410)  Labs: Recent Labs    04/03/18 0007 04/03/18 0712 04/03/18 1346  04/04/18 0417 04/04/18 0823 04/05/18 0306 04/05/18 1604  HGB  --  11.7*  --   --  10.8*  --  9.9*  --   HCT  --  35.3*  --   --  34.2*  --  31.2*  --   PLT  --  459*  --   --  437*  --  396  --   HEPARINUNFRC  --  0.25*  --    < >  --  0.54 0.95* 0.93*  CREATININE  --  1.41*  --   --  1.24*  --  1.71*  --   TROPONINI 1.08* 1.08* 0.80*  --   --   --   --   --    < > = values in this interval not displayed.    Estimated Creatinine Clearance: 14 mL/min (A) (by C-G formula based on SCr of 1.71 mg/dL (H)).   Medical History: Past Medical History:  Diagnosis Date  . Arrhythmia   . Arthritis   . Complete heart block ([place])   . Glaucoma   . Hyperlipidemia   . Thyroid disease     Assessment: 85 y/o F with CT angio confirmation of PE on heparin IV.   Heparin level remains SUPRA-therapeutic after decrease to 700 units/hr.  Will decrease further and repeat level.    Goal of Therapy:  Heparin level 0.3-0.7 units/ml Monitor platelets by anticoagulation protocol: Yes   Plan:  -Decrease heparin infusion to 550 units/hr - Recheck heparin level in 6 to 8 hours -Daily heparin level and CBC  Sloan Leiter, PharmD, BCPS, BCCCP Clinical Pharmacist Clinical phone 04/05/2018 until 10:30PM - #[phone] Please refer to [facility] for [place] numbers  04/05/2018, 5:10 PM

## 2018-04-05 NOTE — Care Management Note (Signed)
Case Management Note  Patient Details  Name: Allissa Albright MRN: [id number] Date of Birth: Apr 17, 1921  Subjective/Objective:                    Action/Plan:   Expected Discharge Date:                  Expected Discharge Plan:     In-House Referral:     Discharge planning Services     Post Acute Care Choice:    Choice offered to:     DME Arranged:    DME Agency:     HH Arranged:    HH Agency:     Status of Service:     If discussed at [employer], dates discussed:    Additional Comments: Per automated system for [employer] Xarelto and Eliquis are $35.00 retail and $70.00 for 90 day supply system did not indicate need a prior auth.   Kerin Salen 04/05/2018, 4:58 PM

## 2018-04-05 NOTE — Progress Notes (Signed)
[facility] TEAM 1 - Stepdown/ICU TEAM  Gloria Gonzalez  TIW:[id number] DOB: 12/23/1921 DOA: 04/02/2018 PCP: Hoyt Koch, MD    Brief Narrative:  85 y.o. F w/ a hx of CHB s/p PPM and HLD who presented with generalized weakness worsening over 7 days, with the acute onset of central chest pressure and SOB on the day of her presentation.   In the ED CTa chest noted small B PEs w/ possible multifocal infiltrates.  Significant Events: 1/5 admit   Subjective: To my surprise the patient has stabilized and somewhat improved overnight.  She is sitting up in bed on facemask oxygen but is able to be awoken and can answer questions.  At the time of my exam yesterday she was displaying essentially agonal respirations.  She is mildly confused but conversant.  She does not appear to be in acute distress.  I have discussed the patient's current condition with her family.  We have agreed to continue our current measures including both comfort and active medical care at the same time.  I have explained that should she continue to thrive we will need to discontinue her morphine drip tomorrow and continue with only intermittent morphine boluses as needed for comfort or dyspnea.  Assessment & Plan:  Severe acute hypoxic respiratory failure - sepsis due to bilateral severe community-acquired pneumonia +/- ALI/ARDS cont steroid therapy - titrate oxygen as indicated - cont anxiolytic - cont broad antibiotic coverage to include unlikely but possible MRSA - respiratory virus panel and flu screen negative - if continues to improve, will need to transition away from morphine gtt in next 24hrs   Acute small PE Continue anticoagulation  Acute kidney injury Probable ATN in setting of sepsis - follow trend - UOP favorable at this time   Acute aphasia  Stat CT head without evidence of intracranial bleeding - appears to have stabilized or improved presently - small focal ischemic CVA remains in the differential  - no other focal neurologic deficits at this time  CHB S/p Pacemaker - device changed August 2019  Hypothyroidism  Cont usual home synthroid dose   HLD  DVT prophylaxis: IV heparin  Code Status: DNR - NO CODE Family Communication: Spoke with daughters at bedside Disposition Plan: Stepdown unit  Consultants:  Cardiology   Antimicrobials:  Rocephin 1/4 > 1/5 Zosyn 1/6 > Azithromycin 1/4 > Vancomycin 1/6 >  Objective: Blood pressure (!) 100/58, pulse 89, temperature (!) 96.8 F (36 C), temperature source Axillary, resp. rate 13, height 4\' 10"  (1.473 m), weight 49.1 kg, SpO2 95 %.  Intake/Output Summary (Last 24 hours) at 04/05/2018 1548 Last data filed at 04/05/2018 1500 Gross per 24 hour  Intake 330.53 ml  Output 350 ml  Net -19.47 ml   Filed Weights   04/02/18 1818 04/03/18 0436  Weight: 47.2 kg 49.1 kg    Examination: General: no acute distress - appears comfortably on FM O2  Lungs: Diffuse coarse crackles with poor air movement throughout - no wheezing today  Cardiovascular: RRR w/o gallup or rub  Abdomen: Thin, soft, bowel sounds positive, no mass, no rebound Extremities: No significant edema B LE    CBC: Recent Labs  Lab 04/02/18 1855 04/03/18 0712 04/04/18 0417 04/05/18 0306  WBC 15.2* 16.5* 16.6* 14.9*  NEUTROABS 10.3*  --   --   --   HGB 12.6 11.7* 10.8* 9.9*  HCT 39.5 35.3* 34.2* 31.2*  MCV 96.1 96.2 97.2 97.5  PLT 431* 459* 437* 250   Basic Metabolic  Panel: Recent Labs  Lab 04/02/18 1855 04/03/18 0712 04/04/18 0417 04/05/18 0306  NA 131* 136 136 138  K 4.4 4.3 4.0 4.2  CL 99 102 106 105  CO2 25 24 21* 22  GLUCOSE 116* 109* 137* 182*  BUN 41* 38* 33* 40*  CREATININE 1.33* 1.41* 1.24* 1.71*  CALCIUM 9.1 8.5* 7.9* 8.0*  MG  --   --  2.1  --    GFR: Estimated Creatinine Clearance: 14 mL/min (A) (by C-G formula based on SCr of 1.71 mg/dL (H)).  Liver Function Tests: Recent Labs  Lab 04/02/18 1855 04/04/18 0417 04/05/18 0306  AST 32  37 31  ALT 20 28 29   ALKPHOS 63 67 65  BILITOT 0.5 0.4 0.7  PROT 7.7 6.4* 6.5  ALBUMIN 2.9* 2.3* 2.1*    Cardiac Enzymes: Recent Labs  Lab 04/03/18 0007 04/03/18 0712 04/03/18 1346  TROPONINI 1.08* 1.08* 0.80*    Recent Results (from the past 240 hour(s))  Blood Culture (routine x 2)     Status: None (Preliminary result)   Collection Time: 04/02/18  8:36 PM  Result Value Ref Range Status   Specimen Description BLOOD RIGHT ANTECUBITAL  Final   Special Requests   Final    BOTTLES DRAWN AEROBIC AND ANAEROBIC Blood Culture results may not be optimal due to an excessive volume of blood received in culture bottles   Culture   Final    NO GROWTH 3 DAYS Performed at [facility] [address], [place], [place] [address]    Report Status PENDING  Incomplete  Blood Culture (routine x 2)     Status: None (Preliminary result)   Collection Time: 04/02/18  8:41 PM  Result Value Ref Range Status   Specimen Description BLOOD LEFT ANTECUBITAL  Final   Special Requests   Final    BOTTLES DRAWN AEROBIC AND ANAEROBIC Blood Culture adequate volume   Culture   Final    NO GROWTH 3 DAYS Performed at [facility], [place] [address], [place],  [address]    Report Status PENDING  Incomplete  Respiratory Panel by PCR     Status: None   Collection Time: 04/03/18  1:01 AM  Result Value Ref Range Status   Adenovirus NOT DETECTED NOT DETECTED Final   Coronavirus 229E NOT DETECTED NOT DETECTED Final   Coronavirus HKU1 NOT DETECTED NOT DETECTED Final   Coronavirus NL63 NOT DETECTED NOT DETECTED Final   Coronavirus OC43 NOT DETECTED NOT DETECTED Final   Metapneumovirus NOT DETECTED NOT DETECTED Final   Rhinovirus / Enterovirus NOT DETECTED NOT DETECTED Final   Influenza A NOT DETECTED NOT DETECTED Final   Influenza B NOT DETECTED NOT DETECTED Final   Parainfluenza Virus 1 NOT DETECTED NOT DETECTED Final   Parainfluenza Virus 2 NOT DETECTED NOT DETECTED Final   Parainfluenza  Virus 3 NOT DETECTED NOT DETECTED Final   Parainfluenza Virus 4 NOT DETECTED NOT DETECTED Final   Respiratory Syncytial Virus NOT DETECTED NOT DETECTED Final   Bordetella pertussis NOT DETECTED NOT DETECTED Final   Chlamydophila pneumoniae NOT DETECTED NOT DETECTED Final   Mycoplasma pneumoniae NOT DETECTED NOT DETECTED Final  MRSA PCR Screening     Status: None   Collection Time: 04/04/18  9:09 AM  Result Value Ref Range Status   MRSA by PCR NEGATIVE NEGATIVE Final    Comment:        The GeneXpert MRSA Assay (FDA approved for NASAL specimens only), is one component  of a comprehensive MRSA colonization surveillance program. It is not intended to diagnose MRSA infection nor to guide or monitor treatment for MRSA infections. Performed at [facility], [place] [address], [place], [place] [address]      Scheduled Meds: . budesonide (PULMICORT) nebulizer solution  0.25 mg Nebulization BID  . ipratropium-albuterol  3 mL Nebulization Q6H  . levothyroxine  75 mcg Oral QAC breakfast  . methylPREDNISolone (SOLU-MEDROL) injection  60 mg Intravenous Q12H  . metoprolol tartrate  2.5 mg Intravenous Q8H  . vancomycin variable dose per unstable renal function (pharmacist dosing)   Does not apply See admin instructions   Continuous Infusions: . sodium chloride 10 mL/hr at 04/04/18 0901  . azithromycin Stopped (04/04/18 2209)  . heparin 700 Units/hr (04/05/18 1041)  . morphine 1 mg/hr (04/05/18 0400)  . piperacillin-tazobactam (ZOSYN)  IV 2.25 g (04/05/18 1202)     LOS: 2 days    Cherene Altes, MD [facility] Office  [phone] Pager - Text Page per Amion as per below:  On-Call/Text Page:      [email or web address]  If 7PM-7AM, please contact night-coverage [email or web address] 04/05/2018, 3:48 PM

## 2018-04-06 ENCOUNTER — Encounter (HOSPITAL_COMMUNITY): Payer: Self-pay | Admitting: General Practice

## 2018-04-06 DIAGNOSIS — J189 Pneumonia, unspecified organism: Secondary | ICD-10-CM | POA: Diagnosis not present

## 2018-04-06 DIAGNOSIS — R072 Precordial pain: Secondary | ICD-10-CM | POA: Diagnosis not present

## 2018-04-06 DIAGNOSIS — I2609 Other pulmonary embolism with acute cor pulmonale: Secondary | ICD-10-CM | POA: Diagnosis not present

## 2018-04-06 LAB — CBC
HCT: 31.7 % — ABNORMAL LOW (ref 36.0–46.0)
Hemoglobin: 10.1 g/dL — ABNORMAL LOW (ref 12.0–15.0)
MCH: 31.4 pg (ref 26.0–34.0)
MCHC: 31.9 g/dL (ref 30.0–36.0)
MCV: 98.4 fL (ref 80.0–100.0)
NRBC: 0 % (ref 0.0–0.2)
Platelets: 448 10*3/uL — ABNORMAL HIGH (ref 150–400)
RBC: 3.22 MIL/uL — ABNORMAL LOW (ref 3.87–5.11)
RDW: 13.2 % (ref 11.5–15.5)
WBC: 16.4 10*3/uL — AB (ref 4.0–10.5)

## 2018-04-06 LAB — COMPREHENSIVE METABOLIC PANEL
ALT: 38 U/L (ref 0–44)
ANION GAP: 9 (ref 5–15)
AST: 41 U/L (ref 15–41)
Albumin: 2.3 g/dL — ABNORMAL LOW (ref 3.5–5.0)
Alkaline Phosphatase: 64 U/L (ref 38–126)
BUN: 54 mg/dL — ABNORMAL HIGH (ref 8–23)
CO2: 23 mmol/L (ref 22–32)
Calcium: 8.1 mg/dL — ABNORMAL LOW (ref 8.9–10.3)
Chloride: 104 mmol/L (ref 98–111)
Creatinine, Ser: 1.76 mg/dL — ABNORMAL HIGH (ref 0.44–1.00)
GFR calc non Af Amer: 24 mL/min — ABNORMAL LOW (ref >60)
GFR, EST AFRICAN AMERICAN: 28 mL/min — AB (ref >60)
Glucose, Bld: 146 mg/dL — ABNORMAL HIGH (ref 70–99)
Potassium: 4.2 mmol/L (ref 3.5–5.1)
Sodium: 136 mmol/L (ref 135–145)
Total Bilirubin: 0.7 mg/dL (ref 0.3–1.2)
Total Protein: 6.9 g/dL (ref 6.5–8.1)

## 2018-04-06 LAB — VANCOMYCIN, RANDOM: Vancomycin Rm: 10

## 2018-04-06 LAB — HEPARIN LEVEL (UNFRACTIONATED)
Heparin Unfractionated: 0.55 IU/mL (ref 0.30–0.70)
Heparin Unfractionated: 0.56 IU/mL (ref 0.30–0.70)

## 2018-04-06 MED ORDER — VANCOMYCIN HCL 500 MG IV SOLR
500.0000 mg | INTRAVENOUS | Status: DC
  Administered 2018-04-06: 500 mg via INTRAVENOUS
  Filled 2018-04-06: qty 500

## 2018-04-06 NOTE — Progress Notes (Signed)
ANTICOAGULATION CONSULT NOTE Pharmacy Consult for Heparin  Indication: pulmonary embolus  No Known Allergies  Patient Measurements: Height: 4\' 10"  (147.3 cm) Weight: 108 lb 3.9 oz (49.1 kg) IBW/kg (Calculated) : 40.9 Dosing weight 49.1   Vital Signs: Temp: 97.4 F (36.3 C) (01/07 2343) Temp Source: Axillary (01/07 2343) BP: 90/51 (01/07 2343) Pulse Rate: 86 (01/08 0102)  Labs: Recent Labs    04/03/18 0712 04/03/18 1346  04/04/18 0417  04/05/18 0306 04/05/18 1604 04/05/18 2358  HGB 11.7*  --   --  10.8*  --  9.9*  --   --   HCT 35.3*  --   --  34.2*  --  31.2*  --   --   PLT 459*  --   --  437*  --  396  --   --   HEPARINUNFRC 0.25*  --    < >  --    < > 0.95* 0.93* 0.55  CREATININE 1.41*  --   --  1.24*  --  1.71*  --   --   TROPONINI 1.08* 0.80*  --   --   --   --   --   --    < > = values in this interval not displayed.    Estimated Creatinine Clearance: 14 mL/min (A) (by C-G formula based on SCr of 1.71 mg/dL (H)). Assessment: 85 y.o. female with PE for heparn Goal of Therapy:  Heparin level 0.3-0.7 units/ml Monitor platelets by anticoagulation protocol: Yes   Plan:  Continue Heparin at current rate   Phillis Knack, PharmD, BCPS  04/06/2018, 1:52 AM

## 2018-04-06 NOTE — Progress Notes (Signed)
Pharmacy Antibiotic Note  Gloria Gonzalez is a 85 y.o. female admitted on 04/02/2018 with aspiration pneumonia.  Pharmacy has been consulted for Vancomycin dosing.  Vancomycin level this AM = 10  Day # 3 of broad spectrum antibiotics WBC = 16.4 Cultures negative to date  Scr increased with Zosyn / Vancomycin combination  Plan: Vancomycin 500 mg iv Q 24 hours (Consider stopping - MRSA PCR - ) Continue Zosyn 2.25 grams iv Q 6 hours (Consider streamlining to Unasyn?/change to Cefepime?)  Will monitor renal fxn, C&S, and vanc levels as appropriate.  Height: 4\' 10"  (147.3 cm) Weight: 108 lb 3.9 oz (49.1 kg) IBW/kg (Calculated) : 40.9  Temp (24hrs), Avg:97.4 F (36.3 C), Min:97.2 F (36.2 C), Max:97.7 F (36.5 C)  Recent Labs  Lab 04/02/18 1855 04/02/18 2053 04/03/18 0712 04/04/18 0417 04/05/18 0306 04/06/18 0819 04/06/18 0901  WBC 15.2*  --  16.5* 16.6* 14.9*  --  16.4*  CREATININE 1.33*  --  1.41* 1.24* 1.71* 1.76*  --   LATICACIDVEN  --  1.07  --   --   --   --   --   VANCORANDOM  --   --   --   --  15 10  --     Estimated Creatinine Clearance: 13.6 mL/min (A) (by C-G formula based on SCr of 1.76 mg/dL (H)).    No Known Allergies  Antimicrobials this admission: Vanc 01/06 >>  Zosyn 01/06 >> Azithro 01/04 >>  Rocephin 01/04 >> 01/06  Microbiology results: 01/04 BCx: NGTD  Thank you for allowing pharmacy to be a part of this patient's care. Anette Guarneri, PharmD Please see AMION for all Pharmacists' Contact Phone Numbers 04/06/2018, 10:14 AM

## 2018-04-06 NOTE — Care Management Important Message (Signed)
Important Message  Patient Details  Name: Shatasha Lambing MRN: [id number] Date of Birth: [birth year]   Medicare Important Message Given:  Yes    Rutherford Alarie P Babb 04/06/2018, 2:22 PM

## 2018-04-06 NOTE — Progress Notes (Signed)
50 ml's of IV morphine drip wasted in sharps with Madaline Savage, RN as witness.

## 2018-04-06 NOTE — Progress Notes (Signed)
Gloria Gonzalez  Gloria Gonzalez  IEP:[id number] DOB: Feb 01, 1922 DOA: 04/02/2018 PCP: Gloria Koch, MD    Brief Narrative:  85 y.o. F w/ a hx of CHB s/p PPM and HLD who presented with generalized weakness worsening over 7 days, with the acute onset of central chest pressure and SOB on the day of presentation.   In the ED CTa chest noted small B PEs w/ possible multifocal infiltrates.  Significant Events: 1/5 admit   Subjective: Patient currently on oxygen by nasal cannula.  She is oriented x3 at this time.  She denies having any pain at this time.  However, creatinine worsening. Discontinue her morphine drip and continue with only intermittent morphine boluses as needed for comfort or dyspnea.  Assessment & Plan:  Severe acute hypoxic respiratory failure - sepsis due to bilateral severe community-acquired pneumonia +/- ALI/ARDS cont steroid therapy - titrate oxygen as indicated - cont anxiolytic - respiratory virus panel and flu screen negative - if continues to improve, will need to transition away from morphine gtt in next 24hrs  04/06/18: -Patient's creatinine worsening with the combination of vancomycin/Zosyn.  Since she is improving we will discontinue IV vancomycin.  Continue Zosyn, azithromycin. -Continue supportive management.  Requested speech/swallow evaluation to evaluate for aspiration. -If she worsens after discontinuing the IV vancomycin we may need to consider switching to IV ceftaroline, Flagyl.  Acute small PE Continue anticoagulation  Acute kidney injury Probable ATN in setting of sepsis - follow trend - UOP favorable at this time  04/06/18: -Creatinine worsening.  Suspect combination of sepsis/ATN/contrast-induced nephropathy due to the CTA and antibiotics? -Started gentle IV hydration.  Discontinued vancomycin.  Continue to monitor BUN/creatinine and urine output closely.  Acute aphasia  Stat CT head without evidence of intracranial  bleeding - appears to have stabilized or improved presently - small focal ischemic CVA remains in the differential - no other focal neurologic deficits at this time 04/06/18: -Currently mental status improving.  CHB S/p Pacemaker - device changed August 2019  Hypothyroidism  Cont usual home synthroid dose   Leukocytosis: Likely secondary to the steroids. -Continue to monitor.  HLD  DVT prophylaxis: IV heparin  Code Status: DNR - NO CODE Family Communication: Spoke with multiple family members including daughters and son, daughter-in-law at bedside Disposition Plan: Unknown at this time  Consultants:  Cardiology   Antimicrobials:  Rocephin 1/4 > 1/5 Zosyn 1/6 > Azithromycin 1/4 > Vancomycin 1/6; discontinued 1/8  Objective: Blood pressure (!) 105/51, pulse 76, temperature (!) 97.2 F (36.2 C), temperature source Oral, resp. rate (!) 8, height 4\' 10"  (1.473 m), weight 49.1 kg, SpO2 94 %.  Intake/Output Summary (Last 24 hours) at 04/06/2018 1407 Last data filed at 04/06/2018 0900 Gross per 24 hour  Intake 682.91 ml  Output 575 ml  Net 107.91 ml   Filed Weights   04/02/18 1818 04/03/18 0436  Weight: 47.2 kg 49.1 kg    Examination: General: no acute distress - appears comfortably on FM O2  Lungs: Decreased breath sound lower lobes, scattered rhonchi, occasional wheezing Cardiovascular: RRR w/o gallup or rub  Abdomen: Thin, soft, bowel sounds positive, no mass, no rebound Extremities: No significant edema B LE    CBC: Recent Labs  Lab 04/02/18 1855 04/03/18 0712 04/04/18 0417 04/05/18 0306 04/06/18 0901  WBC 15.2* 16.5* 16.6* 14.9* 16.4*  NEUTROABS 10.3*  --   --   --   --   HGB 12.6 11.7* 10.8* 9.9* 10.1*  HCT  39.5 35.3* 34.2* 31.2* 31.7*  MCV 96.1 96.2 97.2 97.5 98.4  PLT 431* 459* 437* 396 128*   Basic Metabolic Panel: Recent Labs  Lab 04/02/18 1855 04/03/18 0712 04/04/18 0417 04/05/18 0306 04/06/18 0819  NA 131* 136 136 138 136  K 4.4 4.3 4.0 4.2  4.2  CL 99 102 106 105 104  CO2 25 24 21* 22 23  GLUCOSE 116* 109* 137* 182* 146*  BUN 41* 38* 33* 40* 54*  CREATININE 1.33* 1.41* 1.24* 1.71* 1.76*  CALCIUM 9.1 8.5* 7.9* 8.0* 8.1*  MG  --   --  2.1  --   --    GFR: Estimated Creatinine Clearance: 13.6 mL/min (A) (by C-G formula based on SCr of 1.76 mg/dL (H)).  Liver Function Tests: Recent Labs  Lab 04/02/18 1855 04/04/18 0417 04/05/18 0306 04/06/18 0819  AST 32 37 31 41  ALT 20 28 29  38  ALKPHOS 63 67 65 64  BILITOT 0.5 0.4 0.7 0.7  PROT 7.7 6.4* 6.5 6.9  ALBUMIN 2.9* 2.3* 2.1* 2.3*    Cardiac Enzymes: Recent Labs  Lab 04/03/18 0007 04/03/18 0712 04/03/18 1346  TROPONINI 1.08* 1.08* 0.80*    Recent Results (from the past 240 hour(s))  Blood Culture (routine x 2)     Status: None (Preliminary result)   Collection Time: 04/02/18  8:36 PM  Result Value Ref Range Status   Specimen Description BLOOD RIGHT ANTECUBITAL  Final   Special Requests   Final    BOTTLES DRAWN AEROBIC AND ANAEROBIC Blood Culture results may not be optimal due to an excessive volume of blood received in culture bottles   Culture   Final    NO GROWTH 4 DAYS Performed at [facility] [address] [place], [place] [address]    Report Status PENDING  Incomplete  Blood Culture (routine x 2)     Status: None (Preliminary result)   Collection Time: 04/02/18  8:41 PM  Result Value Ref Range Status   Specimen Description BLOOD LEFT ANTECUBITAL  Final   Special Requests   Final    BOTTLES DRAWN AEROBIC AND ANAEROBIC Blood Culture adequate volume   Culture   Final    NO GROWTH 4 DAYS Performed at [facility], [place] [address], [place],  [address]    Report Status PENDING  Incomplete  Respiratory Panel by PCR     Status: None   Collection Time: 04/03/18  1:01 AM  Result Value Ref Range Status   Adenovirus NOT DETECTED NOT DETECTED Final   Coronavirus 229E NOT DETECTED NOT DETECTED Final   Coronavirus HKU1 NOT DETECTED NOT  DETECTED Final   Coronavirus NL63 NOT DETECTED NOT DETECTED Final   Coronavirus OC43 NOT DETECTED NOT DETECTED Final   Metapneumovirus NOT DETECTED NOT DETECTED Final   Rhinovirus / Enterovirus NOT DETECTED NOT DETECTED Final   Influenza A NOT DETECTED NOT DETECTED Final   Influenza B NOT DETECTED NOT DETECTED Final   Parainfluenza Virus 1 NOT DETECTED NOT DETECTED Final   Parainfluenza Virus 2 NOT DETECTED NOT DETECTED Final   Parainfluenza Virus 3 NOT DETECTED NOT DETECTED Final   Parainfluenza Virus 4 NOT DETECTED NOT DETECTED Final   Respiratory Syncytial Virus NOT DETECTED NOT DETECTED Final   Bordetella pertussis NOT DETECTED NOT DETECTED Final   Chlamydophila pneumoniae NOT DETECTED NOT DETECTED Final   Mycoplasma pneumoniae NOT DETECTED NOT DETECTED Final  MRSA PCR Screening     Status: None  Collection Time: 04/04/18  9:09 AM  Result Value Ref Range Status   MRSA by PCR NEGATIVE NEGATIVE Final    Comment:        The GeneXpert MRSA Assay (FDA approved for NASAL specimens only), is one component of a comprehensive MRSA colonization surveillance program. It is not intended to diagnose MRSA infection nor to guide or monitor treatment for MRSA infections. Performed at [facility], [place] [address], [place], [place] [address]      Scheduled Meds: . budesonide (PULMICORT) nebulizer solution  0.25 mg Nebulization BID  . ipratropium-albuterol  3 mL Nebulization Q6H  . levothyroxine  75 mcg Oral QAC breakfast  . methylPREDNISolone (SOLU-MEDROL) injection  60 mg Intravenous Q12H  . metoprolol tartrate  2.5 mg Intravenous Q8H   Continuous Infusions: . sodium chloride 10 mL/hr at 04/04/18 0901  . azithromycin Stopped (04/05/18 2343)  . heparin 550 Units/hr (04/06/18 0600)  . morphine 1 mg/hr (04/06/18 0600)  . piperacillin-tazobactam (ZOSYN)  IV 2.25 g (04/06/18 1256)  . vancomycin 500 mg (04/06/18 1259)     LOS: 3 days    Yaakov Guthrie, MD [facility] Office  [phone] Pager - Text Page per Shea Evans as per below:  On-Call/Text Page:      [email or web address]  If 7PM-7AM, please contact night-coverage [email or web address] 04/06/2018, 2:07 PM

## 2018-04-07 ENCOUNTER — Inpatient Hospital Stay (HOSPITAL_COMMUNITY): Payer: Medicare Other

## 2018-04-07 DIAGNOSIS — Z515 Encounter for palliative care: Secondary | ICD-10-CM | POA: Diagnosis not present

## 2018-04-07 DIAGNOSIS — N179 Acute kidney failure, unspecified: Secondary | ICD-10-CM | POA: Diagnosis not present

## 2018-04-07 DIAGNOSIS — R072 Precordial pain: Secondary | ICD-10-CM | POA: Diagnosis not present

## 2018-04-07 DIAGNOSIS — J189 Pneumonia, unspecified organism: Secondary | ICD-10-CM | POA: Diagnosis not present

## 2018-04-07 DIAGNOSIS — R9389 Abnormal findings on diagnostic imaging of other specified body structures: Secondary | ICD-10-CM | POA: Diagnosis not present

## 2018-04-07 DIAGNOSIS — I2609 Other pulmonary embolism with acute cor pulmonale: Secondary | ICD-10-CM | POA: Diagnosis not present

## 2018-04-07 LAB — BASIC METABOLIC PANEL
Anion gap: 11 (ref 5–15)
BUN: 58 mg/dL — ABNORMAL HIGH (ref 8–23)
CO2: 20 mmol/L — ABNORMAL LOW (ref 22–32)
Calcium: 7.9 mg/dL — ABNORMAL LOW (ref 8.9–10.3)
Chloride: 107 mmol/L (ref 98–111)
Creatinine, Ser: 1.56 mg/dL — ABNORMAL HIGH (ref 0.44–1.00)
GFR calc Af Amer: 33 mL/min — ABNORMAL LOW (ref >60)
GFR calc non Af Amer: 28 mL/min — ABNORMAL LOW (ref >60)
Glucose, Bld: 161 mg/dL — ABNORMAL HIGH (ref 70–99)
POTASSIUM: 4 mmol/L (ref 3.5–5.1)
Sodium: 138 mmol/L (ref 135–145)

## 2018-04-07 LAB — CULTURE, BLOOD (ROUTINE X 2)
Culture: NO GROWTH
Culture: NO GROWTH
Special Requests: ADEQUATE

## 2018-04-07 LAB — CBC
HCT: 33.1 % — ABNORMAL LOW (ref 36.0–46.0)
HEMOGLOBIN: 10.3 g/dL — AB (ref 12.0–15.0)
MCH: 30.6 pg (ref 26.0–34.0)
MCHC: 31.1 g/dL (ref 30.0–36.0)
MCV: 98.2 fL (ref 80.0–100.0)
Platelets: 483 10*3/uL — ABNORMAL HIGH (ref 150–400)
RBC: 3.37 MIL/uL — ABNORMAL LOW (ref 3.87–5.11)
RDW: 13.3 % (ref 11.5–15.5)
WBC: 17.4 10*3/uL — ABNORMAL HIGH (ref 4.0–10.5)
nRBC: 0 % (ref 0.0–0.2)

## 2018-04-07 LAB — HEPARIN LEVEL (UNFRACTIONATED): Heparin Unfractionated: 0.43 IU/mL (ref 0.30–0.70)

## 2018-04-07 MED ORDER — MAGIC MOUTHWASH
5.0000 mL | Freq: Three times a day (TID) | ORAL | Status: DC
  Administered 2018-04-07 (×3): 5 mL via ORAL
  Filled 2018-04-07 (×5): qty 5

## 2018-04-07 MED ORDER — PIPERACILLIN-TAZOBACTAM 3.375 G IVPB
3.3750 g | Freq: Two times a day (BID) | INTRAVENOUS | Status: DC
  Administered 2018-04-07: 3.375 g via INTRAVENOUS
  Filled 2018-04-07 (×3): qty 50

## 2018-04-07 MED ORDER — GLYCOPYRROLATE 0.2 MG/ML IJ SOLN: 0.1000 mg | Freq: Four times a day (QID) | INTRAMUSCULAR | Status: DC | PRN

## 2018-04-07 MED ORDER — PIPERACILLIN-TAZOBACTAM IN DEX 2-0.25 GM/50ML IV SOLN
2.2500 g | Freq: Three times a day (TID) | INTRAVENOUS | Status: DC
  Filled 2018-04-07: qty 50

## 2018-04-07 MED ORDER — AZITHROMYCIN 500 MG PO TABS
500.0000 mg | ORAL_TABLET | ORAL | Status: DC
  Filled 2018-04-07: qty 1

## 2018-04-07 MED ORDER — IPRATROPIUM-ALBUTEROL 0.5-2.5 (3) MG/3ML IN SOLN: 3.0000 mL | RESPIRATORY_TRACT | Status: DC | PRN

## 2018-04-07 MED ORDER — MORPHINE 100MG IN NS 100ML (1MG/ML) PREMIX INFUSION
1.0000 mg/h | INTRAVENOUS | Status: DC
  Administered 2018-04-07: 1 mg/h via INTRAVENOUS
  Filled 2018-04-07 (×2): qty 100

## 2018-04-07 NOTE — Progress Notes (Addendum)
SLP Cancellation Note  Patient Details Name: Gloria Gonzalez MRN: [id number] DOB: April 30, 1921   Cancelled treatment:       Reason Eval/Treat Not Completed: Fatigue/lethargy limiting ability to participate. RN reports pt was given morphine a couple hours ago to facilitate rest. When she is awake, pt is more anxious and respiratory distress increases. Lengthy discussion with 2 daughters and their niece regarding rationale for SLP consult and SLP role at this time. Palliative Care consult is pending. At this time, SLP and daughters agree that proceeding with bedside swallow evaluation is best deferred at this time.   SLP provided education regarding use of oral swabs for moisture. Specifically, squeezing out excess water, using a swab orally ONCE, and throwing it away (rather than allowing used swabs to sit in water). Family was given several clean swabs to facilitate oral comfort per pt request.  Additional PO intake is not recommended at this time. SLP will continue to follow pt, reviewing chart for decisions and change in pt status, and to provide continued education to the family.    Thank you for this referral.  Celia B. Quentin [facility], CCC-SLP Speech Language Pathologist [phone]  Shonna Chock 04/07/2018, 11:57 AM

## 2018-04-07 NOTE — Plan of Care (Signed)
  Problem: Role Relationship: Goal: Family's ability to cope with current situation will improve Outcome: Progressing   Problem: Pain Management: Goal: Satisfaction with pain management regimen will improve Outcome: Progressing   Problem: Respiratory: Goal: Verbalizations of increased ease of respirations will increase Outcome: Not Progressing   Problem: Role Relationship: Goal: Ability to verbalize concerns, feelings, and thoughts to partner or family member will improve Outcome: Not Progressing

## 2018-04-07 NOTE — Progress Notes (Signed)
ANTICOAGULATION CONSULT NOTE Pharmacy Consult for Heparin  Indication: pulmonary embolus  No Known Allergies  Patient Measurements: Height: 4\' 10"  (147.3 cm) Weight: 108 lb 3.9 oz (49.1 kg) IBW/kg (Calculated) : 40.9 Dosing weight 49.1   Vital Signs: Temp: 97.3 F (36.3 C) (01/09 0755) Temp Source: Axillary (01/09 0755) BP: 124/55 (01/09 0800) Pulse Rate: 85 (01/09 0800)  Labs: Recent Labs    04/05/18 0306  04/05/18 2358 04/06/18 0819 04/06/18 0901 04/07/18 0244  HGB 9.9*  --   --   --  10.1* 10.3*  HCT 31.2*  --   --   --  31.7* 33.1*  PLT 396  --   --   --  448* 483*  HEPARINUNFRC 0.95*   < > 0.55 0.56  --  0.43  CREATININE 1.71*  --   --  1.76*  --  1.56*   < > = values in this interval not displayed.    Estimated Creatinine Clearance: 15.4 mL/min (A) (by C-G formula based on SCr of 1.56 mg/dL (H)).   Assessment: 85 y.o. female continues on heparin for PE Heparin level therapeutic CBC stable  Goal of Therapy:  Heparin level 0.3-0.7 units/ml Monitor platelets by anticoagulation protocol: Yes   Plan:  Continue heparin at 550 units / hr Daily heparin level, CBC  Thank you Anette Guarneri, PharmD [phone]   04/07/2018, 8:52 AM

## 2018-04-07 NOTE — Progress Notes (Signed)
RT came to patient room to assess patient.  Upon arrival to patient room, noted that patient's sats were reading 81%, with good waveform, on 15L high-flow nasal cannula.  Took patient off of high-flow and placed on non-rebreather mask.  During the transition between oxygen devices, patient's sats dropped to low of 72% with good waveform.  MD paged.  RN aware.  Sats currently at 86%.  Will continue to monitor.

## 2018-04-07 NOTE — Progress Notes (Signed)
PROGRESS NOTE    Gloria Gonzalez  WEX:[id number]  DOB: June 30, 1921  DOA: 04/02/2018 PCP: Hoyt Koch, MD  Brief Narrative:  85 y.o.F w/ a hx of CHB s/p PPM and HLD who presented with generalized weakness worsening over 7 days, with the acute onset of central chest pressure and SOB on the day of presentation.  In the ED CTa chest noted small B PEsw/ possible multifocal infiltrates.  During the hospital course she has been treated with broad-spectrum antibiotics initially with vancomycin/Zosyn and then transitioned to Zosyn/azithromycin.  She has been on heparin drip for PE.  Hospital course complicated by acute renal failure with worsening creatinine.  Her CODE STATUS has been DNR.  Patient was initiated on morphine drip during the hospital course for comfort measures but she transiently improved and family wanted to give her a chance.  Morphine was transitioned to IV as needed for comfort.  Over the last 24 hours she has had waxing and waning mental status as well as respiratory status.  According to family she had thick tracheal secretions during the early hours today with respiratory distress and has been eating poorly.    Subjective:Patient was minimally communicative when seen in rounds this morning and both daughters/granddaughter were contemplating to initiate comfort measures/morphine drip again.  They however wanted to discuss with other family members including patient's son/daughter-in-law and other grandchildren.  Palliative care consult was called but they have been short staffed and could not see patient today.  Patient's condition deteriorated this afternoon/evening requiring 100% NRBM.  I was paged to come bedside and family requested full comfort measures with resumption of morphine drip understanding this will be end-of-life care.   Objective: Vitals:   04/07/18 1442 04/07/18 1600 04/07/18 1700 04/07/18 1730  BP:  125/78    Pulse:  (!) 113 79 96  Resp:  (!) 27 (!) 25  (!) 26  Temp:   (!) 97.3 F (36.3 C)   TempSrc:   Axillary   SpO2: 91% (!) 87% (!) 83% (!) 86%  Weight:      Height:        Intake/Output Summary (Last 24 hours) at 04/07/2018 1912 Last data filed at 04/07/2018 1801 Gross per 24 hour  Intake 1928.7 ml  Output 450 ml  Net 1478.7 ml   Filed Weights   04/02/18 1818 04/03/18 0436  Weight: 47.2 kg 49.1 kg    Physical Examination:  General exam: Patient on 100% NRB M in mild respiratory distress Respiratory system: Upper airway sounds heard Cardiovascular system: S1 & S2 heard, RRR. Gastrointestinal system: Abdomen is nondistended, soft and nontender.  Central nervous system: Somewhat better alert and communicating earlier this morning but currently lethargic 100% NRB M.   Extremities: Moving all extremities, no edema     Data Reviewed: I have personally reviewed following labs and imaging studies  CBC: Recent Labs  Lab 04/02/18 1855 04/03/18 0712 04/04/18 0417 04/05/18 0306 04/06/18 0901 04/07/18 0244  WBC 15.2* 16.5* 16.6* 14.9* 16.4* 17.4*  NEUTROABS 10.3*  --   --   --   --   --   HGB 12.6 11.7* 10.8* 9.9* 10.1* 10.3*  HCT 39.5 35.3* 34.2* 31.2* 31.7* 33.1*  MCV 96.1 96.2 97.2 97.5 98.4 98.2  PLT 431* 459* 437* 396 448* 938*   Basic Metabolic Panel: Recent Labs  Lab 04/03/18 0712 04/04/18 0417 04/05/18 0306 04/06/18 0819 04/07/18 0244  NA 136 136 138 136 138  K 4.3 4.0 4.2 4.2 4.0  CL  102 106 105 104 107  CO2 24 21* 22 23 20*  GLUCOSE 109* 137* 182* 146* 161*  BUN 38* 33* 40* 54* 58*  CREATININE 1.41* 1.24* 1.71* 1.76* 1.56*  CALCIUM 8.5* 7.9* 8.0* 8.1* 7.9*  MG  --  2.1  --   --   --    GFR: Estimated Creatinine Clearance: 15.4 mL/min (A) (by C-G formula based on SCr of 1.56 mg/dL (H)). Liver Function Tests: Recent Labs  Lab 04/02/18 1855 04/04/18 0417 04/05/18 0306 04/06/18 0819  AST 32 37 31 41  ALT 20 28 29  38  ALKPHOS 63 67 65 64  BILITOT 0.5 0.4 0.7 0.7  PROT 7.7 6.4* 6.5 6.9  ALBUMIN  2.9* 2.3* 2.1* 2.3*   No results for input(s): LIPASE, AMYLASE in the last 168 hours. No results for input(s): AMMONIA in the last 168 hours. Coagulation Profile: No results for input(s): INR, PROTIME in the last 168 hours. Cardiac Enzymes: Recent Labs  Lab 04/03/18 0007 04/03/18 0712 04/03/18 1346  TROPONINI 1.08* 1.08* 0.80*   BNP (last 3 results) No results for input(s): PROBNP in the last 8760 hours. HbA1C: No results for input(s): HGBA1C in the last 72 hours. CBG: No results for input(s): GLUCAP in the last 168 hours. Lipid Profile: No results for input(s): CHOL, HDL, LDLCALC, TRIG, CHOLHDL, LDLDIRECT in the last 72 hours. Thyroid Function Tests: No results for input(s): TSH, T4TOTAL, FREET4, T3FREE, THYROIDAB in the last 72 hours. Anemia Panel: No results for input(s): VITAMINB12, FOLATE, FERRITIN, TIBC, IRON, RETICCTPCT in the last 72 hours. Sepsis Labs: Recent Labs  Lab 04/02/18 2053 04/03/18 0007 04/04/18 0417 04/05/18 0306  PROCALCITON  --  0.12 0.10 0.86  LATICACIDVEN 1.07  --   --   --     Recent Results (from the past 240 hour(s))  Blood Culture (routine x 2)     Status: None   Collection Time: 04/02/18  8:36 PM  Result Value Ref Range Status   Specimen Description BLOOD RIGHT ANTECUBITAL  Final   Special Requests   Final    BOTTLES DRAWN AEROBIC AND ANAEROBIC Blood Culture results may not be optimal due to an excessive volume of blood received in culture bottles   Culture   Final    NO GROWTH 5 DAYS Performed at [facility] [address], [place], [place] [address]    Report Status 04/07/2018 FINAL  Final  Blood Culture (routine x 2)     Status: None   Collection Time: 04/02/18  8:41 PM  Result Value Ref Range Status   Specimen Description BLOOD LEFT ANTECUBITAL  Final   Special Requests   Final    BOTTLES DRAWN AEROBIC AND ANAEROBIC Blood Culture adequate volume   Culture   Final    NO GROWTH 5 DAYS Performed at [facility],  [place] [address] [place], [place] [address]    Report Status 04/07/2018 FINAL  Final  Respiratory Panel by PCR     Status: None   Collection Time: 04/03/18  1:01 AM  Result Value Ref Range Status   Adenovirus NOT DETECTED NOT DETECTED Final   Coronavirus 229E NOT DETECTED NOT DETECTED Final   Coronavirus HKU1 NOT DETECTED NOT DETECTED Final   Coronavirus NL63 NOT DETECTED NOT DETECTED Final   Coronavirus OC43 NOT DETECTED NOT DETECTED Final   Metapneumovirus NOT DETECTED NOT DETECTED Final   Rhinovirus / Enterovirus NOT DETECTED NOT DETECTED Final   Influenza A NOT DETECTED NOT DETECTED  Final   Influenza B NOT DETECTED NOT DETECTED Final   Parainfluenza Virus 1 NOT DETECTED NOT DETECTED Final   Parainfluenza Virus 2 NOT DETECTED NOT DETECTED Final   Parainfluenza Virus 3 NOT DETECTED NOT DETECTED Final   Parainfluenza Virus 4 NOT DETECTED NOT DETECTED Final   Respiratory Syncytial Virus NOT DETECTED NOT DETECTED Final   Bordetella pertussis NOT DETECTED NOT DETECTED Final   Chlamydophila pneumoniae NOT DETECTED NOT DETECTED Final   Mycoplasma pneumoniae NOT DETECTED NOT DETECTED Final  MRSA PCR Screening     Status: None   Collection Time: 04/04/18  9:09 AM  Result Value Ref Range Status   MRSA by PCR NEGATIVE NEGATIVE Final    Comment:        The GeneXpert MRSA Assay (FDA approved for NASAL specimens only), is one component of a comprehensive MRSA colonization surveillance program. It is not intended to diagnose MRSA infection nor to guide or monitor treatment for MRSA infections. Performed at [facility], [place] [address], [place], [place] [address]       Radiology Studies: Dg Chest Port 1 View  Result Date: 04/07/2018 CLINICAL DATA:  Pneumonia and productive cough EXAM: PORTABLE CHEST 1 VIEW COMPARISON:  04/05/2018 FINDINGS: Dual-chamber pacer leads from the left in stable position. Chronic cardiomegaly with bulky mitral annular calcification. History of pneumonia with  diffuse bilateral lung opacity. Lung volumes are low. No definite effusion. No pneumothorax. IMPRESSION: Unchanged extensive lung opacity from pulmonary edema and/or infection. Electronically Signed   By: Monte Fantasia M.D.   On: 04/07/2018 07:25        Scheduled Meds: Continuous Infusions: . morphine 1 mg/hr (04/07/18 1822)    Assessment & Plan:   After discussing with patient's daughters, son, daughter-in-law and grandchildren on multiple occasions today, comfort measures initiated.  Consulted palliative care but they could not evaluate patient today.  Her prognosis is terminal and family requested resumption of morphine drip.  O2, neb treatment as needed and Robinul as needed secretions ordered for comfort as well.  As discussed with family and agreed upon, IV fluids, antibiotics, oral medications discontinued and n.p.o. order placed. Code Status: DNR/DNI/comfort care Family / Patient Communication: As described above      LOS: 4 days    Total Time spent: 50 minutes with greater than 50% in care coordination/end-of-life discussions.    Guilford Shi, MD [facility] Pager [phone]  If 7PM-7AM, please contact night-coverage [email or web address] Password [email or web address] 04/07/2018, 7:12 PM

## 2018-04-07 NOTE — Plan of Care (Signed)
  Problem: Respiratory: distress, SPO2 75-88% with HFNCL 15 PLM, with multiple breathing treatments. Goal: Verbalizations of increased ease of respirations will increase 04/07/2018 @ 1800 by Fulton Reek, RN Outcome: Not progressing. Problem: Role Relationship: family member cannot cope with lost their love one. Goal: Family's ability to cope with current situation will improve. Outcome: Progressing: family members discussed with Dr. Earnest Conroy, agreed comfort care to Pt. Able to cope better with current situation.   Problem: Clinical Measurements: Pt is anxious, stress, restless. Goal: Quality of life will improve Outcome: Progressing: stated Morphine IV drip 1 mg/hr, with PRN Ativan IV. Pt is appearing with peaceful sleep and calm, rest better with out anxiety.  Kennyth Lose, BSN, RN PCCN-CMC

## 2018-04-07 NOTE — Plan of Care (Signed)
     Problem: Respiratory distress, SPO2 88-92 % with 15 LPM of HFNCL, tachypnea,shallow and short breathing.    Goal: Ability to maintain a clear airway will improve, ability to maintain adequate ventilation will improve.   Outcome: Not Progressing; unable to  cough effectively, auscultated: bilateral lungs congested, crackle with expiratory wheezing. Breathing treatment given by RT. Pt appears anxious and uncomfortable. Morphine given PRN IV and consult palliative care. Monitor closely.  Kennyth Lose, BSN, RN,PCCN-CMC

## 2018-04-08 DIAGNOSIS — J189 Pneumonia, unspecified organism: Secondary | ICD-10-CM | POA: Diagnosis not present

## 2018-04-08 DIAGNOSIS — I2609 Other pulmonary embolism with acute cor pulmonale: Secondary | ICD-10-CM | POA: Diagnosis not present

## 2018-04-08 DIAGNOSIS — Z7189 Other specified counseling: Secondary | ICD-10-CM | POA: Diagnosis not present

## 2018-04-08 DIAGNOSIS — R7989 Other specified abnormal findings of blood chemistry: Secondary | ICD-10-CM | POA: Diagnosis not present

## 2018-04-08 DIAGNOSIS — R0602 Shortness of breath: Secondary | ICD-10-CM

## 2018-04-08 DIAGNOSIS — R072 Precordial pain: Secondary | ICD-10-CM | POA: Diagnosis not present

## 2018-04-08 DIAGNOSIS — Z515 Encounter for palliative care: Secondary | ICD-10-CM

## 2018-04-08 DIAGNOSIS — R0902 Hypoxemia: Secondary | ICD-10-CM | POA: Diagnosis not present

## 2018-04-08 MED ORDER — MORPHINE BOLUS VIA INFUSION
1.0000 mg | INTRAVENOUS | Status: DC | PRN
  Administered 2018-04-09: 1 mg via INTRAVENOUS
  Filled 2018-04-08: qty 1

## 2018-04-08 MED ORDER — GLYCOPYRROLATE 0.2 MG/ML IJ SOLN: 0.2000 mg | INTRAMUSCULAR | Status: DC | PRN

## 2018-04-08 NOTE — Progress Notes (Signed)
   04/08/18 1000  Clinical Encounter Type  Visited With Patient and family together  Visit Type Initial  Referral From Nurse  Consult/Referral To Chaplain  Spiritual Encounters  Spiritual Needs Emotional  Stress Factors  Patient Stress Factors Health changes  Family Stress Factors Health changes;None identified   While rounding floor Nurse stated that PT and family may be in need of spiritual support. PT was unable to talk and family was at bedside. Family stated that they were looking to offer other types of care for PT as PT transitions. I offered spiritual care with words of encouragement, ministry of presence,  And a listening ear. Chaplain available as needed.  Chaplain Fidel Levy  [phone]

## 2018-04-08 NOTE — Consult Note (Signed)
Consultation Note Date: 04/08/2018   Patient Name: Gloria Gonzalez  DOB: [birth year]  MRN: [id number]  Age / Sex: 85 y.o., female  PCP: Hoyt Koch, MD Referring Physician: Modena Jansky, MD  Reason for Consultation: Establishing goals of care, Inpatient hospice referral, Non pain symptom management, Pain control, Psychosocial/spiritual support and Terminal Care  HPI/Patient Profile: 84 y.o. female  with past medical history of complete heart block status post pacemaker, hyperlipidemia admitted on 04/02/2018 with weakness and shortness of breath.  Patient was also complaining of chest pain.  She was found to have multifocal pneumonia, small bilateral PEs.  Consult ordered for goals of care as well as symptom management; hospice.   Clinical Assessment and Goals of Care: Patient seen, chart reviewed.  Her family is in from [place] and are at the bedside.  They recognize that she is nearing end of life.  She is currently wearing 100% nonrebreather mask but without distress.  She is on a morphine continuous infusion at 1 mg an hour and family feels that respiratory pattern has stabilized now and she looks comfortable.  No nonverbal signs and symptoms of pain noticed such as grimacing  NEXT OF KIN.  Son, daughter, daughter-in-law as well as granddaughter at the bedside    SUMMARY OF RECOMMENDATIONS   DNR/DNI Comfort care Continue with morphine continuous infusion.  We will add bolus Family would like to transfer to residential hospice .  Offered choice per Medicare guidelines.  Family is hopeful for bed at [facility].  I did call hospice and palliative care of [place]'s direct referral as well as social work consult placed. Durable DNR placed on chart Code Status/Advance Care Planning:  DNR    Symptom Management:   Dyspnea: Continue with morphine continuous infusion at 1 mg an hour.  We  will add bolus at 1 mg every 20 minutes as needed for shortness of breath or pain  Anxiety: Ativan 0.5 to 1 mg every 4 hours as needed.  Monitor for need for scheduled dosing  Patient's: Continue with Robinul 0.2 mg every 4 hours as needed.  Monitor for need for scheduled dosing  Palliative Prophylaxis:   Aspiration, Bowel Regimen, Delirium Protocol, Eye Care, Frequent Pain Assessment, Oral Care and Turn Reposition  Additional Recommendations (Limitations, Scope, Preferences):  Full Comfort Care  Psycho-social/Spiritual:   Desire for further Chaplaincy support:no  Additional Recommendations: Education on Hospice  Prognosis:   Hours - Days in the setting of multifocal bilateral pneumonia with small bilateral PEs.  She is no longer able to take anything by mouth.  She is currently on a morphine continuous infusion to manage dyspnea  Discharge Planning: Hospice facility      Primary Diagnoses: Present on Admission: . Acute pulmonary embolism with acute cor pulmonale (HCC) . Infiltrate noted on imaging study . Elevated troponin   I have reviewed the medical record, interviewed the patient and family, and examined the patient. The following aspects are pertinent.  Past Medical History:  Diagnosis Date  . Arrhythmia   .  Arthritis    "knees" (04/06/2018)  . Complete heart block ([place])   . Glaucoma   . Hyperlipidemia   . Hypothyroidism   . Pneumonia 04/02/2018  . Pulmonary embolism ([place]) 04/02/2018  . Skin cancer 2018   "forehead"  . Thyroid disease    Social History   Socioeconomic History  . Marital status: Widowed    Spouse name: Not on file  . Number of children: Not on file  . Years of education: Not on file  . Highest education level: Not on file  Occupational History  . Not on file  Social Needs  . Financial resource strain: Not on file  . Food insecurity:    Worry: Not on file    Inability: Not on file  . Transportation needs:    Medical: Not on file      Non-medical: Not on file  Tobacco Use  . Smoking status: Never Smoker  . Smokeless tobacco: Never Used  Substance and Sexual Activity  . Alcohol use: Never    Frequency: Never  . Drug use: No  . Sexual activity: Not on file  Lifestyle  . Physical activity:    Days per week: Not on file    Minutes per session: Not on file  . Stress: Not on file  Relationships  . Social connections:    Talks on phone: Not on file    Gets together: Not on file    Attends religious service: Not on file    Active member of club or organization: Not on file    Attends meetings of clubs or organizations: Not on file    Relationship status: Not on file  Other Topics Concern  . Not on file  Social History Narrative  . Not on file   Family History  Problem Relation Age of Onset  . Arthritis Mother   . Heart disease Sister   . Heart disease Brother    Scheduled Meds: Continuous Infusions: . morphine 1 mg/hr (04/07/18 1822)   PRN Meds:.diclofenac sodium, glycopyrrolate, ipratropium-albuterol, LORazepam, morphine Medications Prior to Admission:  Prior to Admission medications   Medication Sig Start Date End Date Taking? Authorizing Provider  atorvastatin (LIPITOR) 20 MG tablet TAKE 1 TABLET BY MOUTH DAILY AT 6 PM Patient taking differently: Take 20 mg by mouth daily.  08/10/17  Yes Hoyt Koch, MD  Calcium Citrate-Vitamin D (CALCIUM + D PO) Take 1 tablet by mouth 2 (two) times daily.    Yes [provider]  diclofenac sodium (VOLTAREN) 1 % GEL Apply 2 g topically 4 (four) times daily. Patient taking differently: Apply 2 g topically 4 (four) times daily as needed (pain).  02/01/18  Yes Hoyt Koch, MD  Glucosamine-Chondroitin (COSAMIN DS PO) Take 1 tablet by mouth 2 (two) times daily.   Yes [provider]  levothyroxine (SYNTHROID, LEVOTHROID) 75 MCG tablet Take 1 tablet (75 mcg total) by mouth daily. Patient taking differently: Take 75 mcg by mouth daily  before breakfast.  06/21/17  Yes Hoyt Koch, MD  Multiple Vitamins-Minerals (PRESERVISION AREDS 2) CAPS Take 1 capsule by mouth 2 (two) times daily.   Yes [provider]  omega-3 acid ethyl esters (LOVAZA) 1 g capsule TAKE 1 CAPSULE BY MOUTH TWICE DAILY Patient taking differently: Take 1 g by mouth 2 (two) times daily.  07/20/17  Yes Hoyt Koch, MD  raloxifene (EVISTA) 60 MG tablet TAKE 1 TABLET BY MOUTH DAILY Patient taking differently: Take 60 mg by mouth daily.  08/10/17  Yes Hoyt Koch, MD  triamcinolone cream (KENALOG) 0.1 % Apply 1 application topically 2 (two) times daily as needed (irritation).   Yes [provider]   No Known Allergies Review of Systems  Unable to perform ROS: Acuity of condition    Physical Exam Vitals signs and nursing note reviewed. Exam conducted with a chaperone present.  Constitutional:      Comments: Elderly female who appears acutely ill.  She is unresponsive to light touch and voice She is transitioning towards end-of-life  HENT:     Head: Normocephalic and atraumatic.     Mouth/Throat:     Mouth: Mucous membranes are dry.  Pulmonary:     Comments: Respirations 13-minute Wearing 100% nonrebreather mask without distress Skin:    General: Skin is warm and dry.  Neurological:     Comments: Unable to test  Psychiatric:     Comments: No overt agitation otherwise unable to test     Vital Signs: BP 125/78 (BP Location: Right Arm)   Pulse 96   Temp (!) 97.3 F (36.3 C) (Axillary)   Resp (!) 26   Ht 4\' 10"  (1.473 m)   Wt 49.1 kg   SpO2 (!) 86%   BMI 22.62 kg/m  Pain Scale: Faces   Pain Score: Asleep   SpO2: SpO2: (!) 86 % O2 Device:SpO2: (!) 86 % O2 Flow Rate: .O2 Flow Rate (L/min): 15 L/min  IO: Intake/output summary:   Intake/Output Summary (Last 24 hours) at 04/08/2018 1000 Last data filed at 04/08/2018 0900 Gross per 24 hour  Intake 1717.45 ml  Output 450 ml  Net 1267.45 ml     LBM: Last BM Date: 04/07/18 Baseline Weight: Weight: 47.2 kg Most recent weight: Weight: 49.1 kg     Palliative Assessment/Data:   Flowsheet Rows     Most Recent Value  Intake Tab  Referral Department  Hospitalist  Unit at Time of Referral  Intermediate Care Unit  Palliative Care Primary Diagnosis  Pulmonary  Date Notified  04/07/18  Reason for referral  Counsel Regarding Hospice, Non-pain Symptom, Clarify Goals of Care, Psychosocial or Spiritual support  Date of Admission  04/03/18  Date first seen by Palliative Care  04/08/18  # of days Palliative referral response time  1 Day(s)  # of days IP prior to Palliative referral  4  Clinical Assessment  Pain Max last 24 hours  Not able to report  Pain Min Last 24 hours  Not able to report  Dyspnea Max Last 24 Hours  Not able to report  Dyspnea Min Last 24 hours  Not able to report  Nausea Max Last 24 Hours  Not able to report  Nausea Min Last 24 Hours  Not able to report  Anxiety Max Last 24 Hours  Not able to report  Anxiety Min Last 24 Hours  Not able to report  Other Max Last 24 Hours  Not able to report  Psychosocial & Spiritual Assessment  Palliative Care Outcomes  Patient/Family meeting held?  Yes  Who was at the meeting?  pt's dtr, DIL, son, granddaughter  Palliative Care Outcomes  Improved pain interventions, Improved non-pain symptom therapy, Clarified goals of care, Provided psychosocial or spiritual support, Transitioned to hospice, Completed durable DNR, Changed to focus on comfort  Patient/Family wishes: Interventions discontinued/not started   Mechanical Ventilation, BiPAP, Hemodialysis, Transfusion, Antibiotics, PEG, Tube feedings/TPN, Vasopressors, Transfer out of ICU  Palliative Care follow-up planned  Yes, Facility  Time In: 0855 Time Out: 1005 Time Total: 70 min Greater than 50%  of this time was spent counseling and coordinating care related to the above assessment and plan.  Signed by: Dory Horn, NP   Please contact Palliative Medicine Team phone at [phone] for questions and concerns.  For individual provider: See Shea Evans

## 2018-04-08 NOTE — Progress Notes (Signed)
PROGRESS NOTE   Gloria Gonzalez  XVQ:[id number]    DOB: 09-30-1921    DOA: 04/02/2018  PCP: Hoyt Koch, MD   I have briefly reviewed patients previous medical records in [facility].  Brief Narrative:  85 year old female with PMH of complete heart block status post PPM, HLD, presented to the ED complaining of generalized weakness over 7 days prior to admission and on day of admission developed acute onset of central chest pressure and dyspnea.  CT showed small bilateral PEs, multifocal pneumonia and pulmonary edema.  She was exposed to sick contacts at home with URI symptoms.Troponin was elevated.  Cardiology consulted and felt the troponin elevation was due to acute pulmonary embolism and not due to true ACS.  Echo showed normal EF.  IV heparin was initiated.  On 1/5, noted acute aphasia.  CT head showed no evidence of acute intracranial bleeding.  She was treated aggressively with antibiotics, IV heparin, oxygen supplementation for acute hypoxic respiratory failure, sepsis +/- ALI/ARDS.  Course complicated by acute kidney injury.  On 1/9, she progressively worsened with worsening mental status and respiratory status.  At that time family discussed with [facility] MD and transitioned to full comfort care.  PMT has seen today, anticipated hospital death versus residential hospice discharge on 1/11.   Assessment & Plan:   Principal Problem:   Acute pulmonary embolism with acute cor pulmonale (HCC) Active Problems:   Infiltrate noted on imaging study   Elevated troponin   1. Severe acute hypoxic respiratory failure: Due to sepsis from bilateral severe community-acquired pneumonia +/- ALI/ARDS: Treated aggressively with IV antibiotics, steroids, oxygen supplementation, anxiolytics, including IV morphine.  RSV and flu panel negative.  However patient rapidly declined and now transitioned to full comfort care. 2. Acute small pulmonary embolism: Received IV heparin during hospital course.   Now full comfort care. 3. Acute kidney injury: Possibly from ATN related to sepsis.  Creatinine improved but patient declined.  Now comfort care. 4. Acute aphasia: Head CT without evidence of intracranial bleeding.  Stroke not completely ruled out. 5. Complete heart block status post pacemaker: Device changed August 2019.  Follows with Dr. Curt Bears, [facility]. 6. Hypothyroid 7. Leukocytosis 8. Hyperlipidemia   DVT prophylaxis: None due to comfort care. Code Status: DNR Family Communication: Discussed in detail with patient's extended family including son, daughter, granddaughter and daughter-in-law at bedside.  Updated care and answered questions. Disposition: Anticipated hospital death versus DC to residential hospice 1/11.   Consultants:  PMT Cardiology  Procedures:  None  Antimicrobials:  Discontinued now   Subjective: Unresponsive this morning.  As per family, had some hallucinations overnight but mostly comfortable with current medications.  ROS: Unable.  Objective:  Vitals:   04/07/18 1442 04/07/18 1600 04/07/18 1700 04/07/18 1730  BP:  125/78    Pulse:  (!) 113 79 96  Resp:  (!) 27 (!) 25 (!) 26  Temp:   (!) 97.3 F (36.3 C)   TempSrc:   Axillary   SpO2: 91% (!) 87% (!) 83% (!) 86%  Weight:      Height:        Examination:  General exam: Elderly female, small built and frail, lying comfortably in bed without distress. Respiratory system: Reduced breath sounds bilaterally Cardiovascular system: S1 & S2 heard, RRR. No JVD, murmurs, rubs, gallops or clicks. No pedal edema.  Telemetry shows V paced rhythm. Central nervous system: Unresponsive.  Did not attempt to wake her up.      Data  Reviewed: I have personally reviewed following labs and imaging studies  CBC: Recent Labs  Lab 04/02/18 1855 04/03/18 0712 04/04/18 0417 04/05/18 0306 04/06/18 0901 04/07/18 0244  WBC 15.2* 16.5* 16.6* 14.9* 16.4* 17.4*  NEUTROABS 10.3*  --   --   --   --   --     HGB 12.6 11.7* 10.8* 9.9* 10.1* 10.3*  HCT 39.5 35.3* 34.2* 31.2* 31.7* 33.1*  MCV 96.1 96.2 97.2 97.5 98.4 98.2  PLT 431* 459* 437* 396 448* 540*   Basic Metabolic Panel: Recent Labs  Lab 04/03/18 0712 04/04/18 0417 04/05/18 0306 04/06/18 0819 04/07/18 0244  NA 136 136 138 136 138  K 4.3 4.0 4.2 4.2 4.0  CL 102 106 105 104 107  CO2 24 21* 22 23 20*  GLUCOSE 109* 137* 182* 146* 161*  BUN 38* 33* 40* 54* 58*  CREATININE 1.41* 1.24* 1.71* 1.76* 1.56*  CALCIUM 8.5* 7.9* 8.0* 8.1* 7.9*  MG  --  2.1  --   --   --    Liver Function Tests: Recent Labs  Lab 04/02/18 1855 04/04/18 0417 04/05/18 0306 04/06/18 0819  AST 32 37 31 41  ALT 20 28 29  38  ALKPHOS 63 67 65 64  BILITOT 0.5 0.4 0.7 0.7  PROT 7.7 6.4* 6.5 6.9  ALBUMIN 2.9* 2.3* 2.1* 2.3*   Coagulation Profile: No results for input(s): INR, PROTIME in the last 168 hours. Cardiac Enzymes: Recent Labs  Lab 04/03/18 0007 04/03/18 0712 04/03/18 1346  TROPONINI 1.08* 1.08* 0.80*   HbA1C: No results for input(s): HGBA1C in the last 72 hours. CBG: No results for input(s): GLUCAP in the last 168 hours.  Recent Results (from the past 240 hour(s))  Blood Culture (routine x 2)     Status: None   Collection Time: 04/02/18  8:36 PM  Result Value Ref Range Status   Specimen Description BLOOD RIGHT ANTECUBITAL  Final   Special Requests   Final    BOTTLES DRAWN AEROBIC AND ANAEROBIC Blood Culture results may not be optimal due to an excessive volume of blood received in culture bottles   Culture   Final    NO GROWTH 5 DAYS Performed at [facility] [address] [place], [place] [address]    Report Status 04/07/2018 FINAL  Final  Blood Culture (routine x 2)     Status: None   Collection Time: 04/02/18  8:41 PM  Result Value Ref Range Status   Specimen Description BLOOD LEFT ANTECUBITAL  Final   Special Requests   Final    BOTTLES DRAWN AEROBIC AND ANAEROBIC Blood Culture adequate volume   Culture   Final     NO GROWTH 5 DAYS Performed at [facility], [place] [address], [place], [place] [address]    Report Status 04/07/2018 FINAL  Final  Respiratory Panel by PCR     Status: None   Collection Time: 04/03/18  1:01 AM  Result Value Ref Range Status   Adenovirus NOT DETECTED NOT DETECTED Final   Coronavirus 229E NOT DETECTED NOT DETECTED Final   Coronavirus HKU1 NOT DETECTED NOT DETECTED Final   Coronavirus NL63 NOT DETECTED NOT DETECTED Final   Coronavirus OC43 NOT DETECTED NOT DETECTED Final   Metapneumovirus NOT DETECTED NOT DETECTED Final   Rhinovirus / Enterovirus NOT DETECTED NOT DETECTED Final   Influenza A NOT DETECTED NOT DETECTED Final   Influenza B NOT DETECTED NOT DETECTED Final   Parainfluenza Virus 1 NOT DETECTED  NOT DETECTED Final   Parainfluenza Virus 2 NOT DETECTED NOT DETECTED Final   Parainfluenza Virus 3 NOT DETECTED NOT DETECTED Final   Parainfluenza Virus 4 NOT DETECTED NOT DETECTED Final   Respiratory Syncytial Virus NOT DETECTED NOT DETECTED Final   Bordetella pertussis NOT DETECTED NOT DETECTED Final   Chlamydophila pneumoniae NOT DETECTED NOT DETECTED Final   Mycoplasma pneumoniae NOT DETECTED NOT DETECTED Final  MRSA PCR Screening     Status: None   Collection Time: 04/04/18  9:09 AM  Result Value Ref Range Status   MRSA by PCR NEGATIVE NEGATIVE Final    Comment:        The GeneXpert MRSA Assay (FDA approved for NASAL specimens only), is one component of a comprehensive MRSA colonization surveillance program. It is not intended to diagnose MRSA infection nor to guide or monitor treatment for MRSA infections. Performed at [facility], [place] [address] [place], [place] [address]          Radiology Studies: Dg Chest Port 1 View  Result Date: 04/07/2018 CLINICAL DATA:  Pneumonia and productive cough EXAM: PORTABLE CHEST 1 VIEW COMPARISON:  04/05/2018 FINDINGS: Dual-chamber pacer leads from the left in stable position. Chronic cardiomegaly with  bulky mitral annular calcification. History of pneumonia with diffuse bilateral lung opacity. Lung volumes are low. No definite effusion. No pneumothorax. IMPRESSION: Unchanged extensive lung opacity from pulmonary edema and/or infection. Electronically Signed   By: Monte Fantasia M.D.   On: 04/07/2018 07:25        Scheduled Meds: Continuous Infusions: . morphine 1 mg/hr (04/07/18 1822)     LOS: 5 days     Vernell Leep, MD, FACP, [facility]. [facility] Pager [phone]  If 7PM-7AM, please contact night-coverage [email or web address] Password [email or web address] 04/08/2018, 2:21 PM

## 2018-04-08 NOTE — Progress Notes (Signed)
Pt watch removed due to edema, given to daughter at bedside.  Rings still on, unable to be removed without disturbing patient.

## 2018-04-08 NOTE — Progress Notes (Signed)
[facility]) hospital liaison note.  Received request from Romona Curls, NP for family interest in [facility]. Chart reviewed, eligibility confirmed and spoke with family to acknowledge referral. Son, Gloria Gonzalez, states the family is not sure at this time and would like to do a tour of  [facility] prior to making a decision. [facility] Services were explained and advised the son the family can take a tour.   At this time [facility] would be able to offer a room for transfer on 04/09/2018. Family and Thurmond Butts, CSW are aware [facility] liaison will follow up with CSW and family tomorrow.   Please do not hesitate to call with questions.  Thank you,   Farrel Gordon, RN, [facility] Liaison  [place] are on AMION

## 2018-04-09 DIAGNOSIS — I2699 Other pulmonary embolism without acute cor pulmonale: Secondary | ICD-10-CM

## 2018-04-09 DIAGNOSIS — J9601 Acute respiratory failure with hypoxia: Secondary | ICD-10-CM

## 2018-04-09 DIAGNOSIS — J189 Pneumonia, unspecified organism: Secondary | ICD-10-CM | POA: Diagnosis not present

## 2018-04-09 DIAGNOSIS — R7989 Other specified abnormal findings of blood chemistry: Secondary | ICD-10-CM | POA: Diagnosis not present

## 2018-04-09 DIAGNOSIS — Z515 Encounter for palliative care: Secondary | ICD-10-CM

## 2018-04-09 DIAGNOSIS — R0602 Shortness of breath: Secondary | ICD-10-CM | POA: Diagnosis not present

## 2018-04-09 DIAGNOSIS — I2609 Other pulmonary embolism with acute cor pulmonale: Secondary | ICD-10-CM | POA: Diagnosis not present

## 2018-04-09 MED ORDER — LORAZEPAM 2 MG/ML IJ SOLN: 0.5000 mg | INTRAMUSCULAR | Status: AC | PRN

## 2018-04-09 MED ORDER — GLYCOPYRROLATE 0.2 MG/ML IJ SOLN: 0.2000 mg | INTRAMUSCULAR | Status: AC | PRN

## 2018-04-09 MED ORDER — MORPHINE BOLUS VIA INFUSION: 1.0000 mg | INTRAVENOUS | Status: AC | PRN

## 2018-04-09 MED ORDER — MORPHINE 100MG IN NS 100ML (1MG/ML) PREMIX INFUSION: 1.0000 mg/h | INTRAVENOUS | Status: AC

## 2018-04-09 NOTE — Progress Notes (Signed)
Report given to Achille Rich, RN at [facility]. Awaiting [facility] for transport. Will continue to monitor.  Emelda Fear, RN

## 2018-04-09 NOTE — Discharge Summary (Signed)
Physician Discharge Summary  Gloria Gonzalez ZOX:[id number] DOB: 06-26-1921  PCP: Gloria Koch, Gonzalez  Admit date: 04/02/2018 Discharge date: 04/09/2018  Recommendations for Outpatient Follow-up:  1. Gonzalez at [facility] for end-of-life hospice care.  Home Health: N/A Equipment/Devices: N/A    Discharge Condition: Very poor prognosis.  Anticipated rapid decline and death. CODE STATUS: DNR Diet recommendation: Comfort feeds of choice  Discharge Diagnoses:  Principal Problem:   Acute pulmonary embolism with acute cor pulmonale (HCC) Active Problems:   Infiltrate noted on imaging study   Elevated troponin   Brief Summary: 85 year old female with PMH of complete heart block status post PPM, HLD, presented to the ED complaining of generalized weakness over 7 days prior to admission and on day of admission developed acute onset of central chest pressure and dyspnea.  CT showed small bilateral PEs, multifocal pneumonia and pulmonary edema.  She was exposed to sick contacts at home with URI symptoms.Troponin was elevated.  Cardiology consulted and felt the troponin elevation was due to acute pulmonary embolism and not due to true ACS.  Echo showed normal EF.  IV heparin was initiated.  On 1/5, noted acute aphasia.  CT head showed no evidence of acute intracranial bleeding.  She was treated aggressively with antibiotics, IV heparin, oxygen supplementation for acute hypoxic respiratory failure, sepsis +/- ALI/ARDS.  Course complicated by acute kidney injury.  On 1/9, she progressively worsened with worsening mental status and respiratory status.  At that time family discussed with Gloria Gonzalez and transitioned to full comfort care.  PMT was consulted.   Assessment & Plan:   1. Severe acute hypoxic respiratory failure: Due to sepsis from bilateral severe community-acquired pneumonia, pulmonary embolism +/- ALI/ARDS: Treated aggressively with IV antibiotics, steroids, oxygen supplementation,  anxiolytics, including IV morphine.  RSV and flu panel negative.  However patient rapidly declined and now transitioned to full comfort care.  Patient appears quite comfortable with no pain, dyspnea or anxiety issues.  I met with family at patient's bedside along with PMT team, all their questions were answered to their satisfaction, patient will be discharged to [facility]. 2. Acute small pulmonary embolism: Received IV heparin during hospital course.  Now full comfort care. 3. Acute kidney injury: Possibly from ATN related to sepsis.  Creatinine improved but patient declined.  Now comfort care. 4. Acute aphasia: Head CT without evidence of intracranial bleeding.  Stroke not completely ruled out. 5. Complete heart block status post pacemaker: Device changed August 2019.  Followed with Dr. Curt Gonzalez, [place] [facility]. 6. Hypothyroid 7. Leukocytosis 8. Hyperlipidemia    Consultants:  PMT Cardiology  Procedures:  None   Discharge Instructions  Discharge Instructions    Call Gonzalez for:  difficulty breathing, headache or visual disturbances   Complete by:  As directed    Call Gonzalez for:  persistant nausea and vomiting   Complete by:  As directed    Call Gonzalez for:  severe uncontrolled pain   Complete by:  As directed    Call Gonzalez for:  temperature >100.4   Complete by:  As directed    Diet general   Complete by:  As directed    Increase activity slowly   Complete by:  As directed        Medication List    STOP taking these medications   atorvastatin 20 MG tablet Commonly known as:  LIPITOR   CALCIUM + D PO   COSAMIN DS PO   diclofenac sodium 1 % Gel Commonly known  as:  VOLTAREN   levothyroxine 75 MCG tablet Commonly known as:  SYNTHROID, LEVOTHROID   omega-3 acid ethyl esters 1 g capsule Commonly known as:  LOVAZA   PRESERVISION AREDS 2 Caps   raloxifene 60 MG tablet Commonly known as:  EVISTA   triamcinolone cream 0.1 % Commonly known as:  KENALOG     TAKE these  medications   glycopyrrolate 0.2 MG/ML injection Commonly known as:  ROBINUL Inject 1 mL (0.2 mg total) into the vein every 4 (four) hours as needed (SECRETIONS).   LORazepam 2 MG/ML injection Commonly known as:  ATIVAN Inject 0.25-0.5 mLs (0.5-1 mg total) into the vein every 4 (four) hours as needed for anxiety.   morphine 1 mg/mL Soln infusion Inject 1 mg/hr into the vein continuous.   morphine 5 mg/mL Soln Inject 1 mg into the vein every 20 (twenty) minutes as needed (pain or dyspnea).      Follow-up Information    Gonzalez at [facility] Follow up.   Why:  Follow-up regarding residential hospice management.       Gloria Koch, Gonzalez Follow up.   Specialty:  Internal Medicine Why:  Gloria Gonzalez information: [place] [phone]        Gloria Haw, Gonzalez Follow up.   Specialty:  Cardiology Why:  Gloria Gonzalez information: [address] [place] [address] [place] [address] [phone]          No Known Allergies    Procedures/Studies: Dg Chest 2 View  Result Date: 04/02/2018 CLINICAL DATA:  Chest pressure, chest pain EXAM: CHEST - 2 VIEW COMPARISON:  06/21/2017 FINDINGS: LEFT subclavian pacemaker with leads projecting at RIGHT atrium and RIGHT ventricle unchanged. Upper normal heart size. Atherosclerotic calcification of thoracic aorta. Mediastinal contours normal. Diffuse BILATERAL pulmonary infiltrates question edema versus infection. Underlying chronic bronchitic changes. No definite pleural effusion or pneumothorax. Bones diffusely demineralized. IMPRESSION: Diffuse BILATERAL pulmonary infiltrates question pulmonary edema versus infection. Electronically Signed   By: Lavonia Dana M.D.   On: 04/02/2018 20:00   Ct Head Wo Contrast  Result Date: 04/03/2018 CLINICAL DATA:  Acute onset of aphasia. EXAM: CT HEAD WITHOUT CONTRAST TECHNIQUE: Contiguous axial images were obtained from the base of the skull through the vertex without  intravenous contrast. COMPARISON:  None. FINDINGS: Brain: No evidence of acute infarction, hemorrhage, hydrocephalus, extra-axial collection or mass lesion/mass effect. There is ventricular and sulcal enlargement reflecting age-appropriate volume loss. Patchy white matter hypoattenuation is noted bilaterally consistent with moderate chronic microvascular ischemic change. 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 Vascular: No hyperdense vessel or unexpected calcification. Skull: Normal. Negative for fracture or focal lesion. Sinuses/Orbits: Globes and orbits are unremarkable. Sinuses and mastoid air cells are clear. Other: None. IMPRESSION: 1. No acute intracranial abnormalities. 2. Age related volume loss. 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 Electronically Signed   By: Lajean Manes M.D.   On: 04/03/2018 17:57   Ct Angio Chest Pe W And/or Wo Contrast  Result Date: 04/02/2018 CLINICAL DATA:  Chest pressure, shortness of breath, elevated D-dimer, history of heart block EXAM: CT ANGIOGRAPHY CHEST WITH CONTRAST TECHNIQUE: Multidetector CT imaging of the chest was performed using the standard protocol during bolus administration of intravenous contrast. Multiplanar CT image reconstructions and MIPs were obtained to evaluate the vascular anatomy. CONTRAST:  24m ISOVUE-370 IOPAMIDOL (ISOVUE-370) INJECTION 76% IV COMPARISON:  None. FINDINGS: Cardiovascular: Extensive atherosclerotic calcifications of the thoracic aorta, coronary arteries and proximal great  vessels. Aorta normal caliber. Pulmonary arteries adequately opacified. Small filling defects are identified within RIGHT upper lobe and RIGHT lower lobe pulmonary arteries consistent with small pulmonary emboli. No large or central pulmonary emboli are identified. Mediastinum/Nodes: Esophagus unremarkable. Base of cervical region normal appearance. No definite thoracic adenopathy. Few scattered normal size mediastinal  lymph nodes. Upper normal sized 10 mm azygo-esophageal recess node. Lungs/Pleura: Extensive patchy airspace infiltrates throughout BILATERAL upper lobes, RIGHT middle lobe, minimally in lower lobes, favor multifocal pneumonia over edema. Minimal pleural effusions bilaterally. No pneumothorax or definite pulmonary mass. Upper Abdomen: Probable tiny nonobstructing calculus at upper pole LEFT kidney. Remaining visualized upper abdomen unremarkable. Musculoskeletal: Osseous demineralization with multiple thoracic spine compression deformities. Review of the MIP images confirms the above findings. IMPRESSION: Small pulmonary emboli identified within RIGHT upper lobe and RIGHT lower lobe pulmonary arterial branches. Extensive atherosclerotic calcifications of aorta, coronary arteries and proximal great vessels. BILATERAL patchy pulmonary infiltrates favor multifocal pneumonia over edema. Osseous demineralization with multiple thoracic spine compression fractures. Aortic Atherosclerosis (ICD10-I70.0). Critical Value/emergent results were called by telephone at the time of interpretation on 04/02/2018 at 10:33 pm to Dr. Nanda Quinton , who verbally acknowledged these results. Electronically Signed   By: Lavonia Dana M.D.   On: 04/02/2018 22:34   Dg Chest Port 1 View  Result Date: 04/07/2018 CLINICAL DATA:  Pneumonia and productive cough EXAM: PORTABLE CHEST 1 VIEW COMPARISON:  04/05/2018 FINDINGS: Dual-chamber pacer leads from the left in stable position. Chronic cardiomegaly with bulky mitral annular calcification. History of pneumonia with diffuse bilateral lung opacity. Lung volumes are low. No definite effusion. No pneumothorax. IMPRESSION: Unchanged extensive lung opacity from pulmonary edema and/or infection. Electronically Signed   By: Monte Fantasia M.D.   On: 04/07/2018 07:25   Dg Chest Port 1 View  Result Date: 04/05/2018 CLINICAL DATA:  Short of breath pneumonia EXAM: PORTABLE CHEST 1 VIEW COMPARISON:   04/04/2018 FINDINGS: Progression of diffuse bilateral airspace disease. Mild bibasilar atelectasis and small pleural effusions Dual lead pacemaker unchanged. IMPRESSION: Progression of diffuse bilateral airspace disease. Findings compatible with pneumonia versus edema. Electronically Signed   By: Franchot Gallo M.D.   On: 04/05/2018 07:20   Dg Chest Port 1 View  Result Date: 04/04/2018 CLINICAL DATA:  Cough and wheezing.  Pneumonia since Saturday. EXAM: PORTABLE CHEST 1 VIEW COMPARISON:  04/02/2018 and CT 04/02/2018. FINDINGS: Pacer with leads at right atrium and right ventricle. No lead discontinuity. S-shaped thoracolumbar spine curvature. Normal heart size. Atherosclerosis in the transverse aorta. Probable small left pleural effusion. No pneumothorax. Diminished lung volumes. Given differences in technique, relatively similar interstitial and airspace disease bilaterally. Relative sparing of the lung bases. Right hemidiaphragm elevation is mild. IMPRESSION: Similar interstitial and airspace disease bilaterally. Differential considerations remain pulmonary edema and/or infection. Probable small left pleural effusion. Aortic Atherosclerosis (ICD10-I70.0). Electronically Signed   By: Abigail Miyamoto M.D.   On: 04/04/2018 08:51   Vas [place] Lower Extremity Venous (dvt)  Result Date: 04/03/2018  Lower Venous Study Risk Factors: Confirmed PE. Limitations: Body habitus and positioning. Comparison Study: No prior study on file Performing Technologist: Sharion Dove RVS  Examination Guidelines: A complete evaluation includes B-mode imaging, spectral Doppler, color Doppler, and power Doppler as needed of all accessible portions of each vessel. Bilateral testing is considered an integral part of a complete examination. Limited examinations for reoccurring indications may be performed as noted.  Right Venous Findings: +---------+---------------+---------+-----------+----------+-------+           CompressibilityPhasicitySpontaneityPropertiesSummary +---------+---------------+---------+-----------+----------+-------+ CFV  Full           Yes      Yes                          +---------+---------------+---------+-----------+----------+-------+ SFJ      Full                                                 +---------+---------------+---------+-----------+----------+-------+ FV Prox  Full                                                 +---------+---------------+---------+-----------+----------+-------+ FV Mid   Full                                                 +---------+---------------+---------+-----------+----------+-------+ FV DistalFull                                                 +---------+---------------+---------+-----------+----------+-------+ PFV      Full                                                 +---------+---------------+---------+-----------+----------+-------+ POP      Full           Yes      Yes                          +---------+---------------+---------+-----------+----------+-------+ GSV      Full                                                 +---------+---------------+---------+-----------+----------+-------+  Right Technical Findings: Not visualized segments include Not all segments of the posterior tibial and peroneal veins.  Left Venous Findings: +---------+---------------+---------+-----------+----------+-------+          CompressibilityPhasicitySpontaneityPropertiesSummary +---------+---------------+---------+-----------+----------+-------+ CFV      Full           Yes      Yes                          +---------+---------------+---------+-----------+----------+-------+ SFJ      Full                                                 +---------+---------------+---------+-----------+----------+-------+ FV Prox  Full                                                  +---------+---------------+---------+-----------+----------+-------+  FV Mid   Full                                                 +---------+---------------+---------+-----------+----------+-------+ FV DistalFull                                                 +---------+---------------+---------+-----------+----------+-------+ PFV      Full                                                 +---------+---------------+---------+-----------+----------+-------+ POP      Full           Yes      Yes                          +---------+---------------+---------+-----------+----------+-------+ PTV      Full                                                 +---------+---------------+---------+-----------+----------+-------+ GSV      Full                                                 +---------+---------------+---------+-----------+----------+-------+  Left Technical Findings: Not visualized segments include Not all segments of the posterior tibial and peroneal veins.   Summary: Right: There is no evidence of deep vein thrombosis in the lower extremity. However, portions of this examination were limited- see technologist comments above.There is no evidence of superficial venous thrombosis. Ultrasound characteristics of enlarged lymph nodes are noted in the groin. Left: There is no evidence of deep vein thrombosis in the lower extremity. However, portions of this examination were limited- see technologist comments above.There is no evidence of superficial venous thrombosis. Ultrasound characteristics of enlarged lymph nodes noted in the groin.  *See table(s) above for measurements and observations. Electronically signed by Ruta Hinds Gonzalez on 04/03/2018 at 12:23:24 PM.    Final       Subjective: Patient unresponsive.  As per RN, no acute issues reported.  As per extended family at bedside, patient has been comfortable without pain, dyspnea, anxiety or agitation.  She has not had  anything to eat or drink for a couple days now.  Has not woken up much or interacted with family.  Discharge Exam:  Vitals:   04/07/18 1442 04/07/18 1600 04/07/18 1700 04/07/18 1730  BP:  125/78    Pulse:  (!) 113 79 96  Resp:  (!) 27 (!) 25 (!) 26  Temp:   (!) 97.3 F (36.3 C)   TempSrc:   Axillary   SpO2: 91% (!) 87% (!) 83% (!) 86%  Weight:      Height:        General exam: Elderly female, small built and frail, lying comfortably in bed without  distress. Respiratory system: Reduced breath sounds bilaterally Cardiovascular system: S1 & S2 heard, RRR. No JVD, murmurs, rubs, gallops or clicks. No pedal edema.  Telemetry shows V paced rhythm. Central nervous system: Unresponsive.  Did not attempt to wake her up.    The results of significant diagnostics from this hospitalization (including imaging, microbiology, ancillary and laboratory) are listed below for reference.     Microbiology: Recent Results (from the past 240 hour(s))  Blood Culture (routine x 2)     Status: None   Collection Time: 04/02/18  8:36 PM  Result Value Ref Range Status   Specimen Description BLOOD RIGHT ANTECUBITAL  Final   Special Requests   Final    BOTTLES DRAWN AEROBIC AND ANAEROBIC Blood Culture results may not be optimal due to an excessive volume of blood received in culture bottles   Culture   Final    NO GROWTH 5 DAYS Performed at [facility], [place] [address], [place], [place] [address]    Report Status 04/07/2018 FINAL  Final  Blood Culture (routine x 2)     Status: None   Collection Time: 04/02/18  8:41 PM  Result Value Ref Range Status   Specimen Description BLOOD LEFT ANTECUBITAL  Final   Special Requests   Final    BOTTLES DRAWN AEROBIC AND ANAEROBIC Blood Culture adequate volume   Culture   Final    NO GROWTH 5 DAYS Performed at [facility], [place] [address] [place], [place] [address]    Report Status 04/07/2018 FINAL  Final  Respiratory Panel by PCR     Status: None    Collection Time: 04/03/18  1:01 AM  Result Value Ref Range Status   Adenovirus NOT DETECTED NOT DETECTED Final   Coronavirus 229E NOT DETECTED NOT DETECTED Final   Coronavirus HKU1 NOT DETECTED NOT DETECTED Final   Coronavirus NL63 NOT DETECTED NOT DETECTED Final   Coronavirus OC43 NOT DETECTED NOT DETECTED Final   Metapneumovirus NOT DETECTED NOT DETECTED Final   Rhinovirus / Enterovirus NOT DETECTED NOT DETECTED Final   Influenza A NOT DETECTED NOT DETECTED Final   Influenza B NOT DETECTED NOT DETECTED Final   Parainfluenza Virus 1 NOT DETECTED NOT DETECTED Final   Parainfluenza Virus 2 NOT DETECTED NOT DETECTED Final   Parainfluenza Virus 3 NOT DETECTED NOT DETECTED Final   Parainfluenza Virus 4 NOT DETECTED NOT DETECTED Final   Respiratory Syncytial Virus NOT DETECTED NOT DETECTED Final   Bordetella pertussis NOT DETECTED NOT DETECTED Final   Chlamydophila pneumoniae NOT DETECTED NOT DETECTED Final   Mycoplasma pneumoniae NOT DETECTED NOT DETECTED Final  MRSA PCR Screening     Status: None   Collection Time: 04/04/18  9:09 AM  Result Value Ref Range Status   MRSA by PCR NEGATIVE NEGATIVE Final    Comment:        The GeneXpert MRSA Assay (FDA approved for NASAL specimens only), is one component of a comprehensive MRSA colonization surveillance program. It is not intended to diagnose MRSA infection nor to guide or monitor treatment for MRSA infections. Performed at [facility], [place] [address], [place], [place] [address]      Labs: CBC: Recent Labs  Lab 04/02/18 1855 04/03/18 7616 04/04/18 0417 04/05/18 0306 04/06/18 0901 04/07/18 0244  WBC 15.2* 16.5* 16.6* 14.9* 16.4* 17.4*  NEUTROABS 10.3*  --   --   --   --   --   HGB 12.6 11.7* 10.8* 9.9* 10.1*  10.3*  HCT 39.5 35.3* 34.2* 31.2* 31.7* 33.1*  MCV 96.1 96.2 97.2 97.5 98.4 98.2  PLT 431* 459* 437* 396 448* 887*   Basic Metabolic Panel: Recent Labs  Lab 04/03/18 0712 04/04/18 0417 04/05/18 0306  04/06/18 0819 04/07/18 0244  NA 136 136 138 136 138  K 4.3 4.0 4.2 4.2 4.0  CL 102 106 105 104 107  CO2 24 21* 22 23 20*  GLUCOSE 109* 137* 182* 146* 161*  BUN 38* 33* 40* 54* 58*  CREATININE 1.41* 1.24* 1.71* 1.76* 1.56*  CALCIUM 8.5* 7.9* 8.0* 8.1* 7.9*  MG  --  2.1  --   --   --    Liver Function Tests: Recent Labs  Lab 04/02/18 1855 04/04/18 0417 04/05/18 0306 04/06/18 0819  AST 32 37 31 41  ALT _0 38  ALKPHOS 63 67 65 64  BILITOT 0.5 0.4 0.7 0.7  PROT 7.7 6.4* 6.5 6.9  ALBUMIN 2.9* 2.3* 2.1* 2.3*   BNP (last 3 results) Recent Labs    04/02/18 1855  BNP 599.7*   Cardiac Enzymes: Recent Labs  Lab 04/03/18 0007 04/03/18 0712 04/03/18 1346  TROPONINI 1.08* 1.08* 0.80*    I discussed in detail with extended patient's family including son, daughter, granddaughter and others at bedside.  Updated care and answered all questions to their satisfaction.  Time coordinating discharge: 25 minutes  SIGNED:  Vernell Leep, Gonzalez, FACP, [facility]. [facility] Pager [phone]  If 7PM-7AM, please contact night-coverage [email or web address] Password [facility] 04/09/2018, 11:36 AM

## 2018-04-09 NOTE — Progress Notes (Signed)
[place] [id number]-[id number]) RN Visit for [facility] @ 1030 am  Received request from Romona Curls, PMT NP for family interest in [facility] with request to transfer today. Chart reviewed, eligibility confirmed. Met with family to confirm interest and explain services. Family agreeable to transfer today. Caryl Pina, [place] aware. Registration paper work completed. Dr. Orpah Melter to assume care per family request.  Please fax discharge summary to [phone].  RN please call report to [phone].  [facility] liaison will notify Caryl Pina, [place] when [facility] may be called for transport. [place] awaiting delivery of morphine drip.  Thank you, Margaretmary Eddy, RN, [facility] Liaison [phone]  [place] are on AMION.

## 2018-04-09 NOTE — Progress Notes (Addendum)
1 mg morphine bolus given at this time. Patient left the unit via [facility] at this time.  37 ml morphine wasted from bag with Kristin Bruins, RN.

## 2018-04-09 NOTE — Progress Notes (Signed)
Patient seen, chart reviewed.  Patient's daughters as well as son and attending at the bedside.  Patient is now minimally responsive; transitioning towards end-of-life.  She continues on 100% nonrebreather and is tolerating this well.  Family seems more comfortable with this level of oxygen delivery.  She is warm, no mottling noted or apnea.  She is currently on a morphine continuous infusion at 1 mg an hour.  I did discuss with them that further medical orders will be addressed at hospice and palliative care of [facility] residential facility, [facility].  Family verbalized understanding and willingness to proceed with transfer to residential hospice  I did speak to hosp[facility] and [facility] of [facility] liaison this morning and she confirmed bed availability with potential transfer after noon today  Spoke to bedside RN and instructed to leave morphine drip on until patient is leaving the building and premedicate prior to transport.  Please, leave IV in place, cap  Dr. Algis Liming the aware of pending discharge  Thank you for letting palliative medicine be involved in Ms. Hardin Negus care Romona Curls nurse practitioner Time spent: 25 minutes Greater than 50% of time spent in counseling and coordination of care

## 2018-04-09 NOTE — Progress Notes (Signed)
Patient will DC to: [facility] Anticipated DC date: 04/09/2018 Family notified:Margaret (daughter) Transport by: Corey Harold  Per MD patient ready for DC to [facility]. RN, patient, patient's family, and facility notified of DC. Discharge Summary faxed to facility. RN given number for report [phone] . DC packet on chart. Ambulance transport requested for patient.  CSW signing off.  [place], [place]

## 2018-04-11 ENCOUNTER — Telehealth: Payer: Self-pay | Admitting: *Deleted

## 2018-04-11 NOTE — Telephone Encounter (Signed)
Pt was on TCM report admitted 04/02/18 for acute onset of central chest pressure and dyspnea. CT showed small bilateral PEs, multifocal pneumonia and pulmonary edema. On 1/9, she progressively worsened with worsening mental status and respiratory status. Discussed with family and TRH MD will transitioned to full comfort care. Pt D/c 04/09/18, and release to [facility] for end-of-life hospice care.Marland KitchenChryl Heck

## 2018-04-15 LAB — CUP PACEART REMOTE DEVICE CHECK
Battery Impedance: 100 Ohm
Battery Remaining Longevity: 160 mo
Battery Voltage: 2.8 V
Brady Statistic AP VP Percent: 5 %
Brady Statistic AP VS Percent: 0 %
Brady Statistic AS VP Percent: 95 %
Brady Statistic AS VS Percent: 0 %
Date Time Interrogation Session: 20191127142723
Implantable Lead Implant Date: 20031112
Implantable Lead Implant Date: 20031112
Implantable Lead Location: 753859
Implantable Lead Location: 753860
Implantable Lead Model: 4092
Implantable Lead Model: 4592
Implantable Pulse Generator Implant Date: 20190819
Lead Channel Impedance Value: 548 Ohm
Lead Channel Impedance Value: 971 Ohm
Lead Channel Pacing Threshold Amplitude: 0.5 V
Lead Channel Pacing Threshold Pulse Width: 0.4 ms
Lead Channel Setting Pacing Amplitude: 1.5 V
Lead Channel Setting Pacing Amplitude: 2 V
Lead Channel Setting Pacing Pulse Width: 0.4 ms
Lead Channel Setting Sensing Sensitivity: 5.6 mV
MDC IDC MSMT LEADCHNL RA PACING THRESHOLD PULSEWIDTH: 0.4 ms
MDC IDC MSMT LEADCHNL RV PACING THRESHOLD AMPLITUDE: 0.375 V

## 2018-04-30 DEATH — deceased

## 2018-07-05 ENCOUNTER — Ambulatory Visit: Payer: Medicare Other | Admitting: Internal Medicine

## 2019-10-27 IMAGING — CT CT HEAD W/O CM
4 series · 15 of 47 positions shown, 17 images · non-contrast
Comparison: None.

CLINICAL DATA: Acute onset of aphasia.

EXAM:
CT HEAD WITHOUT CONTRAST
TECHNIQUE: Contiguous axial images were obtained from the base of the skull
through the vertex without intravenous contrast.

[Series 3: head without · axial · non-contrast · 0.39mm/px · z∈[-174,-54]mm · 7 of 32 slices shown, 9 images]
[im 4/32  brain]
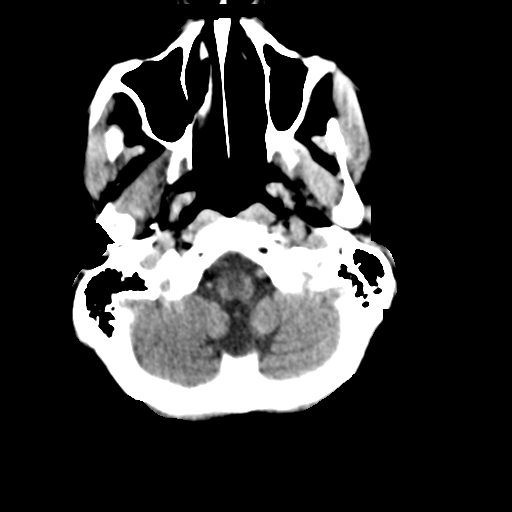
[im 4/32  bone]
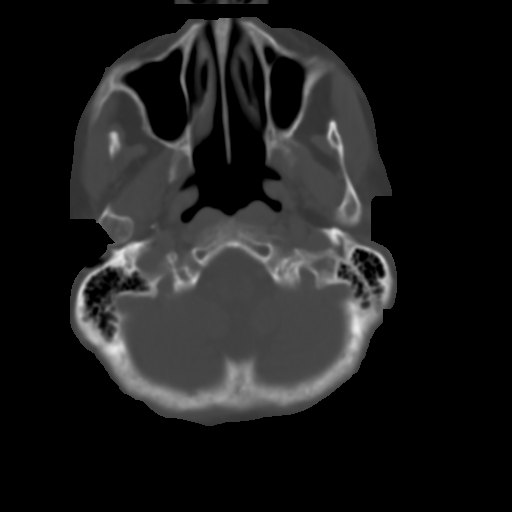
[im 8/32  brain]
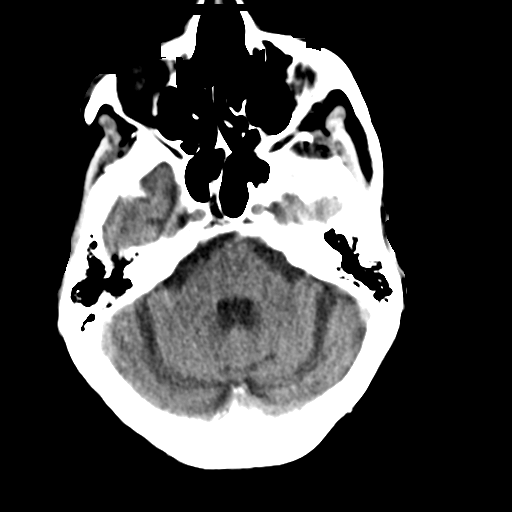
[im 12/32  brain]
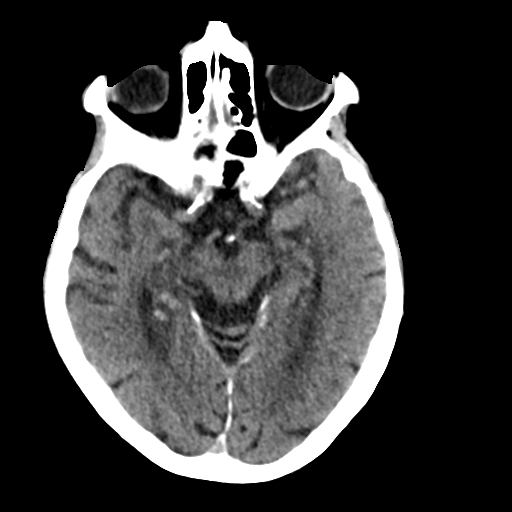
[im 16/32  brain]
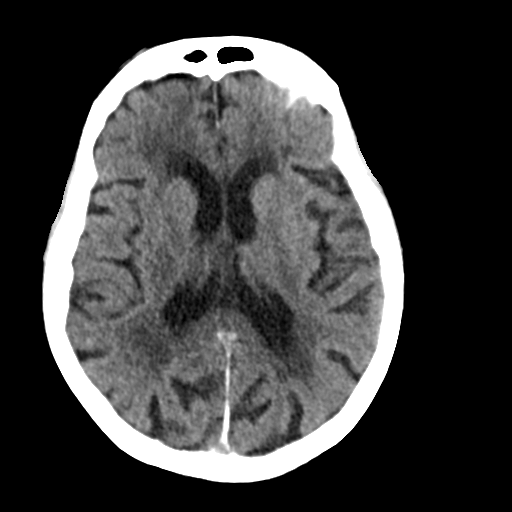
[im 20/32  brain]
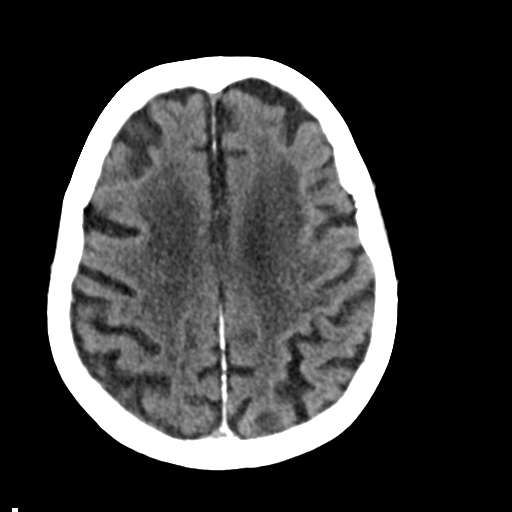
[im 20/32  bone]
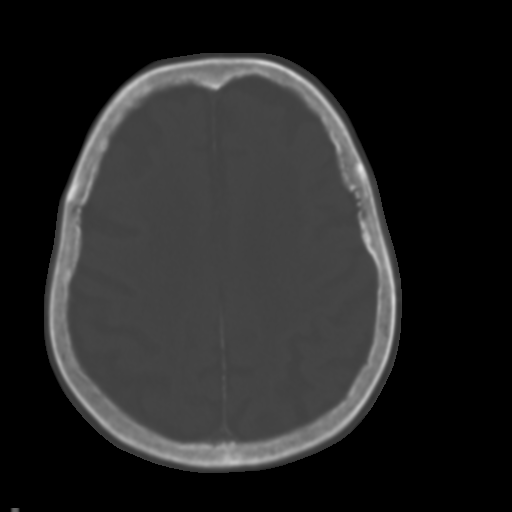
[im 24/32  brain]
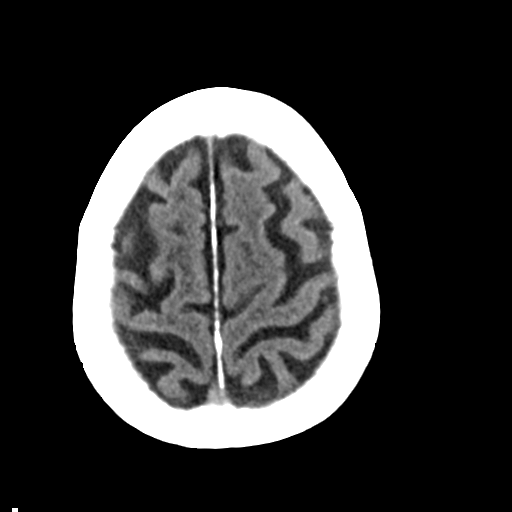
[im 28/32  brain]
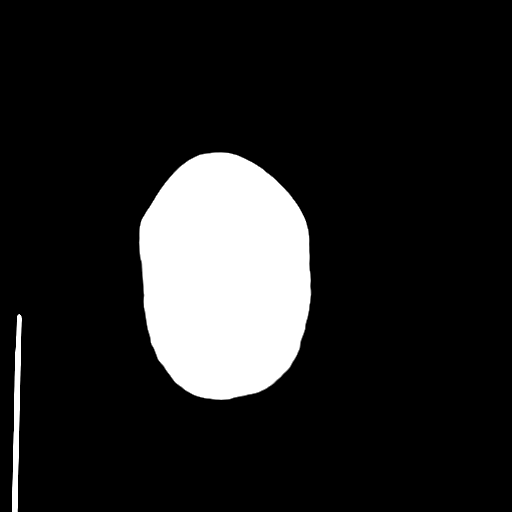

[Series 4: head bone · axial · 0.39mm/px · z∈[-175,-159]mm · 2 of 79 slices shown]
[im 8/79  bone]
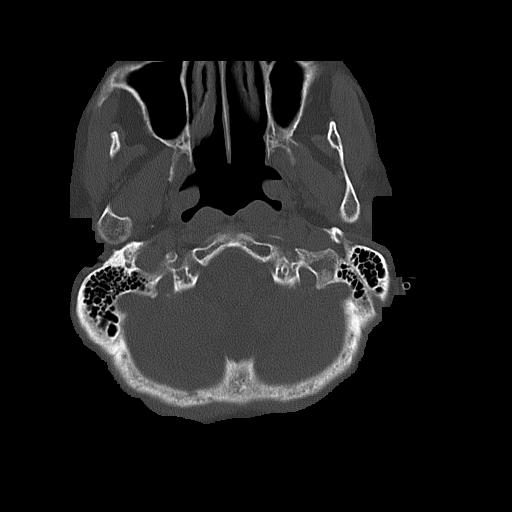
[im 16/79  bone]
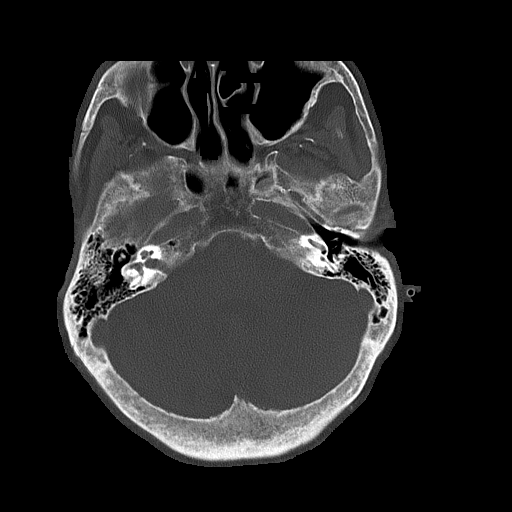

[Series 5: head without cor · coronal · non-contrast · 0.29mm/px · 3 of 67 slices shown]
[im 23/67  brain]
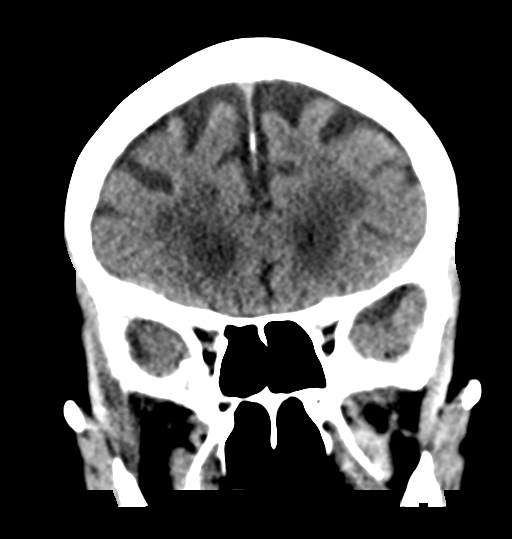
[im 30/67  brain]
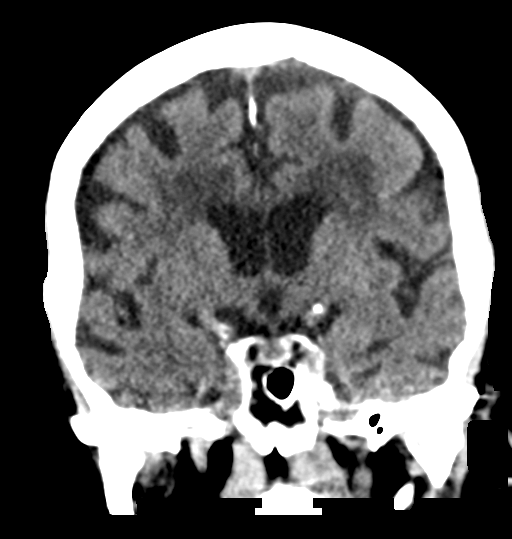
[im 37/67  brain]
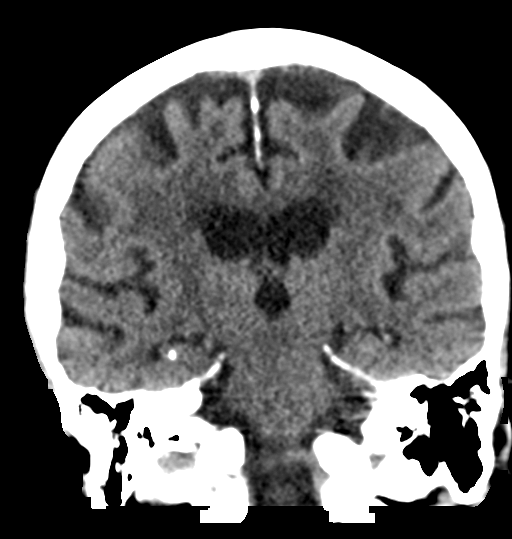

[Series 6: head without sag · sagittal · non-contrast · 0.31mm/px · 3 of 65 slices shown]
[im 22/65  brain]
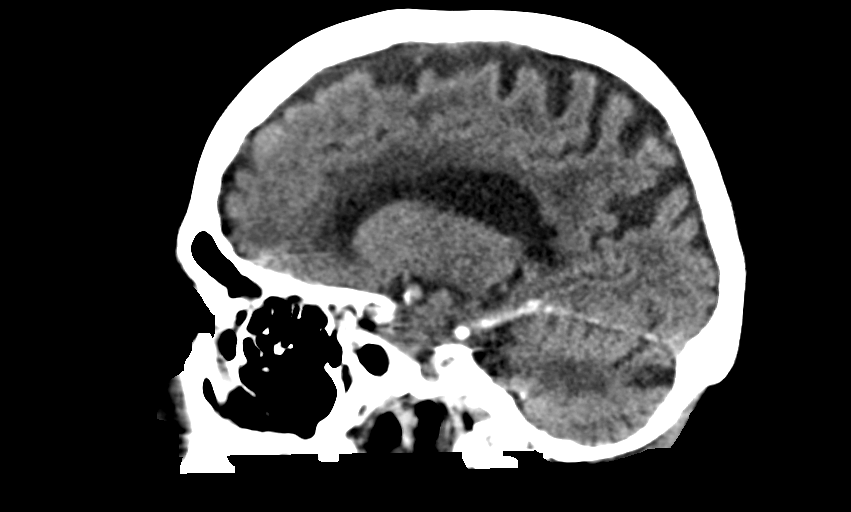
[im 33/65  brain]
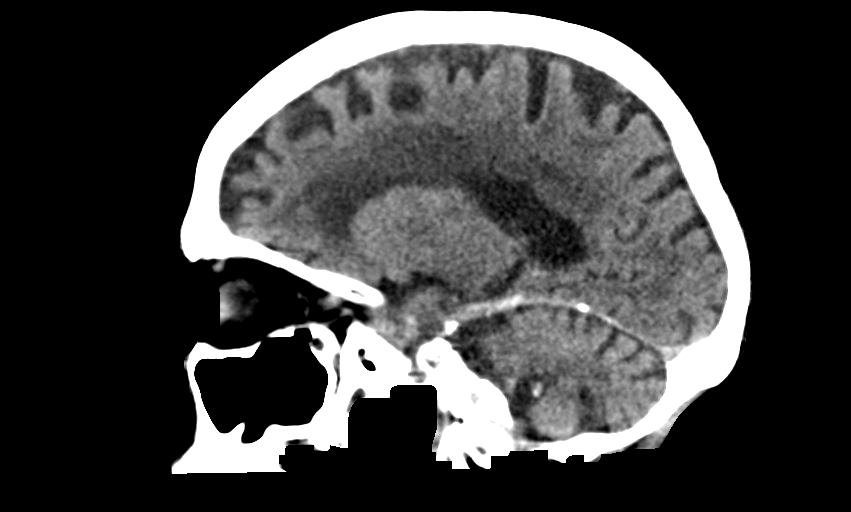
[im 43/65  brain]
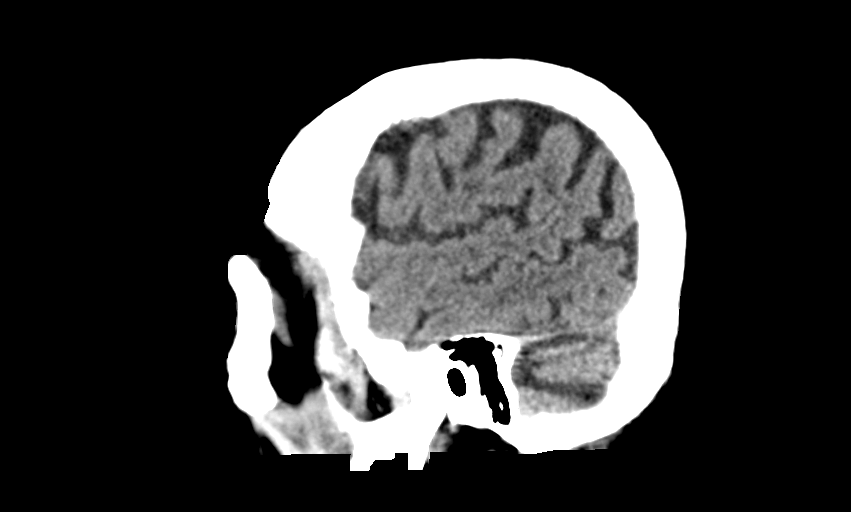

[15 of 47 positions shown; findings below may reference images not displayed]

FINDINGS: Brain: No evidence of acute infarction, hemorrhage, hydrocephalus,
extra-axial collection or mass lesion/mass effect.

There is ventricular and sulcal enlargement reflecting
age-appropriate volume loss. Patchy white matter hypoattenuation is
noted bilaterally consistent with moderate chronic microvascular
ischemic change. There are old right cerebellar infarcts.

Vascular: No hyperdense vessel or unexpected calcification.

Skull: Normal. Negative for fracture or focal lesion.

Sinuses/Orbits: Globes and orbits are unremarkable. Sinuses and
mastoid air cells are clear.

Other: None.
IMPRESSION: 1. No acute intracranial abnormalities.
2. Age related volume loss. Moderate chronic microvascular ischemic
change and old right cerebellar infarcts.
